# Patient Record
Sex: Male | Born: 1935
Health system: Southern US, Community
[De-identification: ages and names within clinical notes are randomized; demographics above are authoritative.]

## PROBLEM LIST (undated history)

## (undated) DIAGNOSIS — N400 Enlarged prostate without lower urinary tract symptoms: Secondary | ICD-10-CM

## (undated) DIAGNOSIS — M1990 Unspecified osteoarthritis, unspecified site: Secondary | ICD-10-CM

## (undated) DIAGNOSIS — I10 Essential (primary) hypertension: Secondary | ICD-10-CM

## (undated) HISTORY — PX: OTHER SURGICAL HISTORY: SHX169

## (undated) HISTORY — PX: VASECTOMY: SHX75

## (undated) HISTORY — PX: COLONOSCOPY: SHX174

## (undated) HISTORY — PX: HERNIA REPAIR: SHX51

---

## 2011-03-07 ENCOUNTER — Other Ambulatory Visit: Payer: Self-pay

## 2011-03-07 DIAGNOSIS — I83893 Varicose veins of bilateral lower extremities with other complications: Secondary | ICD-10-CM

## 2011-03-28 ENCOUNTER — Encounter: Payer: Self-pay | Admitting: Vascular Surgery

## 2011-03-31 ENCOUNTER — Other Ambulatory Visit (INDEPENDENT_AMBULATORY_CARE_PROVIDER_SITE_OTHER): Payer: Medicare Other | Admitting: *Deleted

## 2011-03-31 ENCOUNTER — Ambulatory Visit (INDEPENDENT_AMBULATORY_CARE_PROVIDER_SITE_OTHER): Payer: Medicare Other | Admitting: Vascular Surgery

## 2011-03-31 VITALS — BP 166/89 | HR 71 | Resp 20 | Ht 72.0 in | Wt 190.0 lb

## 2011-03-31 DIAGNOSIS — M7989 Other specified soft tissue disorders: Secondary | ICD-10-CM

## 2011-03-31 DIAGNOSIS — I83893 Varicose veins of bilateral lower extremities with other complications: Secondary | ICD-10-CM | POA: Insufficient documentation

## 2011-03-31 NOTE — Progress Notes (Signed)
Subjective:     Patient ID: Jesus Wagner, male   DOB: Oct 26, 1935, 76 y.o.   MRN: [id number]  HPI this 76 year old male patient has had bulging varicosities in the right posterior medial calf for many years. These have become more prominent. They have developed increasing symptomatology particularly burning and stinging discomfort which extends down to the medial ankle and foot on the sole of the foot. He does have swelling as the day progresses. He has had no history of stasis ulcers, bleeding, DVT, or thrombophlebitis. It is not wear elastic compression stockings. He cannot take ibuprofen because it upset his stomach. He elevates his leg on rare occasions. He states the symptoms are becoming more severe and are beginning to affect his daily living. He is able to ambulate several blocks without claudication.  No past medical history on file.  History  Substance Use Topics  . Smoking status: Not on file  . Smokeless tobacco: Not on file  . Alcohol Use: Not on file    No family history on file.  Allergies not on file  Current outpatient prescriptions:CRESTOR 10 MG tablet, , Disp: , Rfl: ;  lisinopril (PRINIVIL,ZESTRIL) 40 MG tablet, , Disp: , Rfl:   BP 166/89  Pulse 71  Resp 20  Ht 6' (1.829 m)  Wt 190 lb (86.183 kg)  BMI 25.77 kg/m2  Body mass index is 25.77 kg/(m^2).              Review of Systems he denies chest pain, dyspnea on exertion, PND, orthopnea, hemoptysis, lateralizing weakness, diplopia, blurred vision, syncope, and all other symptoms other than the present illness    Objective:   Physical Exam blood pressure 166 radioman heart rate 71 respirations 20 General well-developed well-nourished male no apparent distress alert and oriented x3 HEENT normal for age Lungs no rhonchi or wheezing Cardiovascular regular and no murmurs carotid pulses 3+ no audible bruits Abdomen soft nontender with no palpable masses Neurologic exam normal Skin free of rashes. There  are diffuse reticular and spider veins in the left leg in the lower third extending down into the ankle with 1+ edema and some early hyperpigmentation. There are bulging varicosities particularly in the popliteal fossa extending into the medial proximal calf area. He has 3+ femoral popliteal dorsalis pedis and posterior tibial pulses palpable bilaterally. Right leg is free of varicosities or edema.  Musculoskeletal free of major deformities  Today I ordered a venous reflux exam of the right leg which are reviewed and interpreted. The right great saphenous system has no significant reflux. The right small saphenous system has severe reflux with a prominent small saphenous vein with reflux down to the saphenofemoral popliteal junction. There is about an 8 cm segment of vein before it becomes quite tortuous. This supplies the majority of the varicosities previously described. There is no DVT or deep venous reflux.    Assessment:     Severe venous insufficiency right small saphenous vein with bulging varicosities and symptoms consistent with chronic venous insufficiency, hyperpigmentation, diffuse reticular and spider veins. This is due to gross reflux in right small saphenous system.    Plan:     #1 long-leg elastic compression stockings 20-30 mm gradient #2 elevate legs as much as possible intermittently during the day #3 Tylenol on a regular basis since he cannot tolerate ibuprofen #4 turn to see me in 3 months. If there is not been significant improvement in his symptomatology would recommend laser ablation of left small saphenous vein. He will  then return in 3 months with evaluation for possible stab phlebectomy of secondary bulging varicosities. The patient will return in 3 month

## 2011-04-08 NOTE — Procedures (Unsigned)
LOWER EXTREMITY VENOUS REFLUX EXAM  INDICATION:  EXAM:  Using color-flow imaging and pulse Doppler spectral analysis, the right common femoral, superficial femoral, popliteal, posterior tibial, greater and lesser saphenous veins are evaluated.  There is evidence suggesting deep venous insufficiency in the right lower extremity.  The right saphenofemoral junction is competent.  The right GSV is competent.  The right below-knee mid and distal GSV is incompetent with a reflux greater than 500 milliseconds.  The right proximal small saphenous vein demonstrates incompetency and measures 0.80 cm.  GSV Diameter (used if found to be incompetent only)                                           Right    Left Proximal Greater Saphenous Vein           cm       cm Proximal-to-mid-thigh                     cm       cm Mid thigh                                 cm       cm Mid-distal thigh                          cm       cm Distal thigh                              cm       cm Knee                                      cm       cm GSV below knee mid                        0.40 cm GSV below knee distal                     0.44 cm  IMPRESSION: 1. Right greater saphenous vein is competent in the thigh. 2. The right greater saphenous vein is not tortuous. 3. The deep venous system is not competent with Reflux of     >59milliseconds. 4. The right lesser saphenous vein is not competent with Reflux of     >587milliseconds in the proximal portion. 5. The greater saphenous vein below knee is not competent with Reflux     >500 milliseconds.        ___________________________________________ Quita Skye Hart [place], M.D.  SS/MEDQ  D:  03/31/2011  T:  03/31/2011  Job:  [id number]

## 2011-05-26 ENCOUNTER — Other Ambulatory Visit: Payer: Self-pay

## 2011-05-26 ENCOUNTER — Encounter: Payer: Self-pay | Admitting: Vascular Surgery

## 2011-06-27 ENCOUNTER — Encounter: Payer: Self-pay | Admitting: Vascular Surgery

## 2011-06-30 ENCOUNTER — Ambulatory Visit (INDEPENDENT_AMBULATORY_CARE_PROVIDER_SITE_OTHER): Payer: Medicare Other | Admitting: Vascular Surgery

## 2011-06-30 VITALS — BP 162/74 | HR 93 | Resp 20 | Ht 78.0 in | Wt 195.0 lb

## 2011-06-30 DIAGNOSIS — I83893 Varicose veins of bilateral lower extremities with other complications: Secondary | ICD-10-CM

## 2011-06-30 NOTE — Progress Notes (Signed)
Subjective:     Patient ID: Jesus Wagner, male   DOB: [birth year], 76 y.o.   MRN: [id number]  HPI this 76 year old male with severe venous insufficiency of the right leg returns today for continued followup he has been wearing long leg elastic compression stockings in the right leg as well as trying elevation a daily basis. He is unable to take anti-inflammatories such as ibuprofen. The stocking has improved the swelling which occurs during the day but he continues to have aching throbbing and burning discomfort extending down the posterior calf into the heel. He also has chronic edema which has not been totally relieved by the stocking but improved. He has no symptoms the contralateral left leg. He has no history of DVT or thrombophlebitis.  No past medical history on file.  History  Substance Use Topics  . Smoking status: Not on file  . Smokeless tobacco: Not on file  . Alcohol Use: Not on file    No family history on file.  No Known Allergies  Current outpatient prescriptions:CRESTOR 10 MG tablet, , Disp: , Rfl: ;  lisinopril (PRINIVIL,ZESTRIL) 40 MG tablet, , Disp: , Rfl:   BP 162/74  Pulse 93  Resp 20  Ht 6\' 6"  (1.981 m)  Wt 195 lb (88.451 kg)  BMI 22.53 kg/m2  Body mass index is 22.53 kg/(m^2).         Review of Systems     Objective:   Physical Exam pressure 160/74 heart rate 93 respirations 20 General well-developed well-nourished male no apparent distress alert and oriented x3 Right lower extremity has large bulging varicosities in the popliteal fossa extending down to the medial calf with diffuse spider and reticular veins around the ankle medially and laterally and 1+ edema.  Today I imaged the right small saphenous vein with the SonoSite and he does have a large small saphenous vein which has gross reflux coming from the popliteal vein supplying these bulging varicosities. This was confirmed by for formal duplex exam at the last visit 3 months ago      Assessment:     Severe venous insufficiency right leg secondary to gross reflux right small saphenous vein with bulging varicosities-not relieved by conservative measures and affecting his daily living    Plan:     I believe we should proceed with #1 laser ablation right small saphenous vein to be followed in 3 months with stab phlebectomy of multiple secondary varicosities right calf Will proceed with precertification to perform this in the near future

## 2011-07-04 ENCOUNTER — Other Ambulatory Visit: Payer: Self-pay | Admitting: *Deleted

## 2011-07-04 DIAGNOSIS — I83893 Varicose veins of bilateral lower extremities with other complications: Secondary | ICD-10-CM

## 2011-08-01 ENCOUNTER — Encounter: Payer: Self-pay | Admitting: Vascular Surgery

## 2011-08-04 ENCOUNTER — Encounter: Payer: Self-pay | Admitting: Vascular Surgery

## 2011-08-04 ENCOUNTER — Ambulatory Visit (INDEPENDENT_AMBULATORY_CARE_PROVIDER_SITE_OTHER): Payer: Medicare Other | Admitting: Vascular Surgery

## 2011-08-04 VITALS — BP 194/81 | HR 71 | Resp 20 | Ht 72.0 in | Wt 195.0 lb

## 2011-08-04 DIAGNOSIS — I83893 Varicose veins of bilateral lower extremities with other complications: Secondary | ICD-10-CM

## 2011-08-04 NOTE — Progress Notes (Signed)
Subjective:     Patient ID: Jesus Wagner, male   DOB: 1936-03-19, 76 y.o.   MRN: [id number]  HPI this 76 year old male had laser ablation of the right small saphenous vein performed under local tumescent anesthesia. He tolerated the procedure well. He had a large vein which became tortuous fairly early and a short segment of the vein adjacent to where it connects to the popliteal vein was closed with laser ablation. A total of 435 J of energy was utilized.   Review of Systems     Objective:   Physical ExamBP 194/81  Pulse 71  Resp 20  Ht 6' (1.829 m)  Wt 195 lb (88.451 kg)  BMI 26.45 kg/m2       Assessment:     Well-tolerated laser ablation left small saphenous vein performed under local tumescent anesthesia    Plan:     Return in one week for venous duplex exam to confirm closure of short segment left small saphenous vein adjacent to popliteal vein

## 2011-08-04 NOTE — Progress Notes (Signed)
Laser Ablation Procedure      Date: 08/04/2011    Jesus Wagner DOB:[birth year]  Consent signed: Yes  Surgeon:J.D. Hart [place]  Procedure: Laser Ablation: right Small Saphenous Vein  BP 194/81  Pulse 71  Resp 20  Ht 6' (1.829 m)  Wt 195 lb (88.451 kg)  BMI 26.45 kg/m2  Start time: 11:10   End time: 11:40  Tumescent Anesthesia: 100 cc 0.9% NaCl with 50 cc Lidocaine HCL with 1% Epi and 15 cc 8.4% NaHCO3  Local Anesthesia: 4 cc Lidocaine HCL and NaHCO3 (ratio 2:1)  Pulsed mode: Watts 15 Seconds 1 Pulses:1 Total Pulses:29 Total Energy: 435 Total Time: :29   Patient tolerated procedure well: Yes  Notes:   Description of Procedure:  After marking the course of the saphenous vein and the secondary varicosities in the standing position, the patient was placed on the operating table in the prone position, and the right leg was prepped and draped in sterile fashion. Local anesthetic was administered, and under ultrasound guidance the saphenous vein was accessed with a micro needle and guide wire; then the micro puncture sheath was placed. A guide wire was inserted to the saphenopopliteal junction, followed by a 5 french sheath.  The position of the sheath and then the laser fiber below the junction was confirmed using the ultrasound and visualization of the aiming beam.  Tumescent anesthesia was administered along the course of the saphenous vein using ultrasound guidance. Protective laser glasses were placed on the patient, and the laser was fired at 15 watt pulsed mode advancing 1-2 mm per sec.  For a total of 435 joules.  A steri strip was applied to the puncture site.  ABD pads and thigh high compression stockings were applied.  Ace wrap bandages were applied over the phlebectomy sites and at the top of the saphenopopliteal junction.  Blood loss was less than 15 cc.  The patient ambulated out of the operating room having tolerated the procedure well.

## 2011-08-05 ENCOUNTER — Telehealth: Payer: Self-pay | Admitting: *Deleted

## 2011-08-05 ENCOUNTER — Encounter: Payer: Self-pay | Admitting: Vascular Surgery

## 2011-08-05 NOTE — Telephone Encounter (Signed)
Spoke with the patient's wife. She said he is doing well and had a good night last night. Reminded her of instructions and his fu appt next week.

## 2011-08-08 ENCOUNTER — Encounter: Payer: Self-pay | Admitting: Vascular Surgery

## 2011-08-11 ENCOUNTER — Encounter: Payer: Self-pay | Admitting: Vascular Surgery

## 2011-08-11 ENCOUNTER — Encounter (INDEPENDENT_AMBULATORY_CARE_PROVIDER_SITE_OTHER): Payer: Medicare Other | Admitting: *Deleted

## 2011-08-11 ENCOUNTER — Ambulatory Visit (INDEPENDENT_AMBULATORY_CARE_PROVIDER_SITE_OTHER): Payer: Medicare Other | Admitting: Vascular Surgery

## 2011-08-11 VITALS — BP 174/85 | HR 80 | Resp 18 | Ht 72.0 in | Wt 195.0 lb

## 2011-08-11 DIAGNOSIS — I83893 Varicose veins of bilateral lower extremities with other complications: Secondary | ICD-10-CM

## 2011-08-11 DIAGNOSIS — Z48812 Encounter for surgical aftercare following surgery on the circulatory system: Secondary | ICD-10-CM

## 2011-08-11 DIAGNOSIS — I8310 Varicose veins of unspecified lower extremity with inflammation: Secondary | ICD-10-CM

## 2011-08-11 NOTE — Progress Notes (Signed)
Subjective:     Patient ID: Jesus Wagner, male   DOB: [birth year], 76 y.o.   MRN: [id number]  HPI this 76 year old male returns 1 week post laser ablation of the right small saphenous vein for painful varicosities he tolerated the procedure well performed last week under local tumescent anesthesia. He states the leg feels much better. It is less tight and less throbby. He has had no change in distal edema. He has been wearing his elastic compression stocking as well as elevating his leg and taking ibuprofen as instructed.  No past medical history on file.  History  Substance Use Topics  . Smoking status: Never Smoker   . Smokeless tobacco: Never Used  . Alcohol Use: No    No family history on file.  Allergies  Allergen Reactions  . Aspirin     Current outpatient prescriptions:CRESTOR 10 MG tablet, , Disp: , Rfl: ;  lisinopril (PRINIVIL,ZESTRIL) 40 MG tablet, , Disp: , Rfl:   BP 174/85  Pulse 80  Resp 18  Ht 6' (1.829 m)  Wt 195 lb (88.451 kg)  BMI 26.45 kg/m2  Body mass index is 26.45 kg/(m^2).          Review of Systems denies chest pain, dyspnea on exertion, PND, orthopnea, hemoptysis     Objective:   Physical Exam blood pressure 174/85 heart rate 80 respirations 18 Right lower extremity with 3+ femoral and dorsalis pedis pulse palpable. The bulging varicosities in the posterior calf extending up into the distal thigh R. Mus less tense than previously. There is 1+ distal edema. No active ulcers are noted.  Today I ordered a venous duplex exam of the right leg which are reviewed and interpreted. There is no DVT. There is total closure of the right small saphenous vein from the proximal calf to the saphenous popliteal junction. There was only a short segment which could be closed.    Assessment:     Successful closure right small saphenous vein with laser ablation-severe venous insufficiency with bulging varicosities    Plan:     Return in 3 months to see if stab  phlebectomy indicated for secondary varicosities in calf and distal thigh  Will continue to wear elastic compression stockings for symptomatic relief

## 2011-08-19 NOTE — Procedures (Unsigned)
DUPLEX DEEP VENOUS EXAM - LOWER EXTREMITY  INDICATION:  Right small saphenous vein ablation 08/04/2011.  HISTORY:  Edema:  No Trauma/Surgery:  Right small saphenous vein ablation Pain:  No PE:  No Previous DVT:  No Anticoagulants:  No Other:  DUPLEX EXAM:               CFV   SFV   PopV  PTV    SSV               R  L  R  L  R  L  R   L  R  L Thrombosis    o  o  o     o     o      + Spontaneous   +  +  +     +     +      0 Phasic        +  +  +     +     +      0 Augmentation  +  +  +     +     +      0 Compressible  +  +  +     +     +      0 Competent     +  +  +     +     +      +  Legend:  + - yes  o - no  p - partial  D - decreased   IMPRESSION: 1. Successful right small saphenous vein ablation with no flow     visualized in the proximal, mid or distal segments. 2. No evidence of right lower extremity deep venous thrombosis.      _____________________________ Quita Skye Hart [place], M.D.  EM/MEDQ  D:  08/12/2011  T:  08/12/2011  Job:  [id number]

## 2011-11-17 ENCOUNTER — Ambulatory Visit: Payer: BC Managed Care – PPO | Admitting: Vascular Surgery

## 2013-07-18 ENCOUNTER — Other Ambulatory Visit (HOSPITAL_COMMUNITY): Payer: Self-pay | Admitting: Orthopaedic Surgery

## 2013-07-21 ENCOUNTER — Encounter (HOSPITAL_COMMUNITY): Payer: Self-pay | Admitting: Pharmacy Technician

## 2013-07-26 ENCOUNTER — Encounter (INDEPENDENT_AMBULATORY_CARE_PROVIDER_SITE_OTHER): Payer: Self-pay

## 2013-07-26 ENCOUNTER — Ambulatory Visit (HOSPITAL_COMMUNITY)
Admission: RE | Admit: 2013-07-26 | Discharge: 2013-07-26 | Disposition: A | Discharge status: Home / Self Care | Payer: Medicare Other | Source: Ambulatory Visit | Attending: Orthopaedic Surgery | Admitting: Orthopaedic Surgery

## 2013-07-26 ENCOUNTER — Encounter (HOSPITAL_COMMUNITY)
Admission: RE | Admit: 2013-07-26 | Discharge: 2013-07-26 | Disposition: A | Discharge status: Home / Self Care | Payer: Medicare Other | Source: Ambulatory Visit | Attending: Orthopaedic Surgery | Admitting: Orthopaedic Surgery

## 2013-07-26 ENCOUNTER — Encounter (HOSPITAL_COMMUNITY): Payer: Self-pay

## 2013-07-26 DIAGNOSIS — Z01818 Encounter for other preprocedural examination: Secondary | ICD-10-CM | POA: Insufficient documentation

## 2013-07-26 DIAGNOSIS — Z01812 Encounter for preprocedural laboratory examination: Secondary | ICD-10-CM | POA: Insufficient documentation

## 2013-07-26 HISTORY — DX: Benign prostatic hyperplasia without lower urinary tract symptoms: N40.0

## 2013-07-26 HISTORY — DX: Essential (primary) hypertension: I10

## 2013-07-26 LAB — SURGICAL PCR SCREEN
MRSA, PCR: NEGATIVE
Staphylococcus aureus: POSITIVE — AB

## 2013-07-26 LAB — URINALYSIS, ROUTINE W REFLEX MICROSCOPIC
BILIRUBIN URINE: NEGATIVE
GLUCOSE, UA: NEGATIVE mg/dL
HGB URINE DIPSTICK: NEGATIVE
KETONES UR: NEGATIVE mg/dL
Leukocytes, UA: NEGATIVE
NITRITE: NEGATIVE
PH: 6 (ref 5.0–8.0)
Protein, ur: NEGATIVE mg/dL
Specific Gravity, Urine: 1.013 (ref 1.005–1.030)
Urobilinogen, UA: 0.2 mg/dL (ref 0.0–1.0)

## 2013-07-26 LAB — PROTIME-INR
INR: 0.97 (ref 0.00–1.49)
Prothrombin Time: 12.7 seconds (ref 11.6–15.2)

## 2013-07-26 LAB — CBC
HEMATOCRIT: 39.6 % (ref 39.0–52.0)
Hemoglobin: 14.3 g/dL (ref 13.0–17.0)
MCH: 31.7 pg (ref 26.0–34.0)
MCHC: 36.1 g/dL — ABNORMAL HIGH (ref 30.0–36.0)
MCV: 87.8 fL (ref 78.0–100.0)
PLATELETS: 172 10*3/uL (ref 150–400)
RBC: 4.51 MIL/uL (ref 4.22–5.81)
RDW: 12.1 % (ref 11.5–15.5)
WBC: 6.3 10*3/uL (ref 4.0–10.5)

## 2013-07-26 LAB — BASIC METABOLIC PANEL
BUN: 14 mg/dL (ref 6–23)
CALCIUM: 9.3 mg/dL (ref 8.4–10.5)
CHLORIDE: 100 meq/L (ref 96–112)
CO2: 27 mEq/L (ref 19–32)
Creatinine, Ser: 0.76 mg/dL (ref 0.50–1.35)
GFR calc Af Amer: >90 mL/min (ref >90)
GFR calc non Af Amer: 86 mL/min — ABNORMAL LOW (ref >90)
GLUCOSE: 98 mg/dL (ref 70–99)
Potassium: 4.8 mEq/L (ref 3.7–5.3)
Sodium: 138 mEq/L (ref 137–147)

## 2013-07-26 LAB — APTT: aPTT: 30 seconds (ref 24–37)

## 2013-07-26 NOTE — Pre-Procedure Instructions (Signed)
07-26-13 EKG 07-14-13 report with chart. CXR done today.

## 2013-07-26 NOTE — Patient Instructions (Signed)
20 Jesus PitchRobert Hagey  07/26/2013   Your procedure is scheduled on:  5-15 -2015  [facility] at       0700  AM.  Call this number if you have problems the morning of surgery: [phone]  Or Presurgical Testing [phone]([name]) For Living Will and/or Health Care Power Attorney Forms: please provide copy for your medical record,may bring AM of surgery(Forms should be already notarized -we do not provide this service).(Pt. To bring if can locate at home 08-05-13)      Do not eat food:After Midnight.    Take these medicines the morning of surgery with A SIP OF WATER: pain med if needed.   Do not wear jewelry, make-up or nail polish.  Do not wear lotions, powders, or perfumes. You may wear deodorant.  Do not shave 48 hours(2 days) prior to first CHG shower(legs and under arms).(Shaving face and neck okay.)  Do not bring valuables to the hospital.(Hospital is not responsible for lost valuables).  Contacts, dentures or removable bridgework, body piercing, hair pins may not be worn into surgery.  Leave suitcase in the car. After surgery it may be brought to your room.  For patients admitted to the hospital, checkout time is 11:00 AM the day of discharge.(Restricted visitors-Any Persons displaying flu-like symptoms or illness).    Patients discharged the day of surgery will not be allowed to drive home. Must have responsible person with you x 24 hours once discharged.  Name and phone number of your driver:  Jesus Wagner spouse [phone] h Special Instructions: CHG(Chlorhedine 4%-"Hibiclens","Betasept","Aplicare") Shower Use Special Wash: see special instructions.(avoid face and genitals)   Please read over the following fact sheets that you were given: MRSA Information, Blood Transfusion fact sheet.  Remember : Type/Screen "Blue armbands" - may not be removed once applied(would result in being retested AM of surgery, if removed).  Failure to follow these instructions may  result in Cancellation of your surgery.   Patient signature_______________________________________________________

## 2013-07-27 NOTE — Progress Notes (Signed)
07-27-13 1700 notified of Positive PCR screen for Staph aureus. Rx. [facility] [phone], pt. Made aware.

## 2013-08-05 ENCOUNTER — Encounter (HOSPITAL_COMMUNITY): Payer: Medicare Other | Admitting: Anesthesiology

## 2013-08-05 ENCOUNTER — Encounter (HOSPITAL_COMMUNITY): Payer: Self-pay

## 2013-08-05 ENCOUNTER — Inpatient Hospital Stay (HOSPITAL_COMMUNITY)
Admission: RE | Admit: 2013-08-05 | Discharge: 2013-08-08 | DRG: 470 | Disposition: A | Discharge status: Home Health | Payer: Medicare Other | Source: Ambulatory Visit | Attending: Orthopaedic Surgery | Admitting: Orthopaedic Surgery

## 2013-08-05 ENCOUNTER — Inpatient Hospital Stay (HOSPITAL_COMMUNITY): Payer: Medicare Other | Admitting: Anesthesiology

## 2013-08-05 ENCOUNTER — Inpatient Hospital Stay (HOSPITAL_COMMUNITY): Payer: Medicare Other

## 2013-08-05 ENCOUNTER — Encounter (HOSPITAL_COMMUNITY)
Admission: RE | Disposition: A | Discharge status: Home Health | Payer: Self-pay | Source: Ambulatory Visit | Attending: Orthopaedic Surgery

## 2013-08-05 DIAGNOSIS — M79609 Pain in unspecified limb: Secondary | ICD-10-CM

## 2013-08-05 DIAGNOSIS — I10 Essential (primary) hypertension: Secondary | ICD-10-CM | POA: Diagnosis present

## 2013-08-05 DIAGNOSIS — M169 Osteoarthritis of hip, unspecified: Principal | ICD-10-CM | POA: Diagnosis present

## 2013-08-05 DIAGNOSIS — M1612 Unilateral primary osteoarthritis, left hip: Secondary | ICD-10-CM

## 2013-08-05 DIAGNOSIS — M1610 Unilateral primary osteoarthritis, unspecified hip: Principal | ICD-10-CM | POA: Diagnosis present

## 2013-08-05 DIAGNOSIS — Z96649 Presence of unspecified artificial hip joint: Secondary | ICD-10-CM

## 2013-08-05 HISTORY — PX: TOTAL HIP ARTHROPLASTY: SHX124

## 2013-08-05 LAB — TYPE AND SCREEN
ABO/RH(D): O POS
ANTIBODY SCREEN: NEGATIVE

## 2013-08-05 LAB — ABO/RH: ABO/RH(D): O POS

## 2013-08-05 SURGERY — ARTHROPLASTY, HIP, TOTAL, ANTERIOR APPROACH
Anesthesia: Spinal | Site: Hip | Laterality: Left

## 2013-08-05 MED ORDER — OXYCODONE HCL 5 MG PO TABS
5.0000 mg | ORAL_TABLET | ORAL | Status: DC | PRN
  Administered 2013-08-05: 5 mg via ORAL
  Administered 2013-08-05 – 2013-08-06 (×7): 10 mg via ORAL
  Administered 2013-08-06: 5 mg via ORAL
  Administered 2013-08-07 (×3): 10 mg via ORAL
  Administered 2013-08-07: 5 mg via ORAL
  Administered 2013-08-08 (×2): 10 mg via ORAL
  Filled 2013-08-05: qty 1
  Filled 2013-08-05 (×2): qty 2
  Filled 2013-08-05: qty 1
  Filled 2013-08-05 (×5): qty 2
  Filled 2013-08-05: qty 1
  Filled 2013-08-05 (×5): qty 2

## 2013-08-05 MED ORDER — MEPERIDINE HCL 50 MG/ML IJ SOLN
6.2500 mg | INTRAMUSCULAR | Status: DC | PRN
  Administered 2013-08-05: 12.5 mg via INTRAVENOUS

## 2013-08-05 MED ORDER — PHENYLEPHRINE HCL 10 MG/ML IJ SOLN
INTRAMUSCULAR | Status: AC
  Filled 2013-08-05: qty 1

## 2013-08-05 MED ORDER — CEFAZOLIN SODIUM-DEXTROSE 2-3 GM-% IV SOLR
2.0000 g | INTRAVENOUS | Status: AC
  Administered 2013-08-05: 2 g via INTRAVENOUS

## 2013-08-05 MED ORDER — PHENYLEPHRINE HCL 10 MG/ML IJ SOLN
INTRAMUSCULAR | Status: DC | PRN
  Administered 2013-08-05: 40 ug via INTRAVENOUS

## 2013-08-05 MED ORDER — ONDANSETRON HCL 4 MG PO TABS
4.0000 mg | ORAL_TABLET | Freq: Four times a day (QID) | ORAL | Status: DC | PRN
  Administered 2013-08-07: 4 mg via ORAL
  Filled 2013-08-05: qty 1

## 2013-08-05 MED ORDER — PROPOFOL INFUSION 10 MG/ML OPTIME
INTRAVENOUS | Status: DC | PRN
  Administered 2013-08-05: 100 ug/kg/min via INTRAVENOUS

## 2013-08-05 MED ORDER — POLYETHYLENE GLYCOL 3350 17 G PO PACK
17.0000 g | PACK | Freq: Every day | ORAL | Status: DC | PRN
  Administered 2013-08-07: 17 g via ORAL

## 2013-08-05 MED ORDER — VITAMIN D3 25 MCG (1000 UNIT) PO TABS
1000.0000 [IU] | ORAL_TABLET | Freq: Every day | ORAL | Status: DC
  Administered 2013-08-06 – 2013-08-07 (×2): 1000 [IU] via ORAL
  Filled 2013-08-05 (×3): qty 1

## 2013-08-05 MED ORDER — MEPERIDINE HCL 50 MG/ML IJ SOLN
INTRAMUSCULAR | Status: AC
  Filled 2013-08-05: qty 1

## 2013-08-05 MED ORDER — PROPOFOL 10 MG/ML IV BOLUS
INTRAVENOUS | Status: AC
  Filled 2013-08-05: qty 20

## 2013-08-05 MED ORDER — METHOCARBAMOL 1000 MG/10ML IJ SOLN
500.0000 mg | Freq: Four times a day (QID) | INTRAVENOUS | Status: DC | PRN
  Administered 2013-08-05: 500 mg via INTRAVENOUS
  Filled 2013-08-05: qty 5

## 2013-08-05 MED ORDER — CEFAZOLIN SODIUM-DEXTROSE 2-3 GM-% IV SOLR
INTRAVENOUS | Status: AC
  Filled 2013-08-05: qty 50

## 2013-08-05 MED ORDER — DOCUSATE SODIUM 100 MG PO CAPS
100.0000 mg | ORAL_CAPSULE | Freq: Two times a day (BID) | ORAL | Status: DC
  Administered 2013-08-05 – 2013-08-07 (×5): 100 mg via ORAL

## 2013-08-05 MED ORDER — METHOCARBAMOL 500 MG PO TABS
500.0000 mg | ORAL_TABLET | Freq: Four times a day (QID) | ORAL | Status: DC | PRN
  Administered 2013-08-05 – 2013-08-08 (×8): 500 mg via ORAL
  Filled 2013-08-05 (×8): qty 1

## 2013-08-05 MED ORDER — ALUM & MAG HYDROXIDE-SIMETH 200-200-20 MG/5ML PO SUSP: 30.0000 mL | ORAL | Status: DC | PRN

## 2013-08-05 MED ORDER — HYDROMORPHONE HCL PF 1 MG/ML IJ SOLN
INTRAMUSCULAR | Status: AC
  Filled 2013-08-05: qty 1

## 2013-08-05 MED ORDER — BUPIVACAINE HCL (PF) 0.5 % IJ SOLN
INTRAMUSCULAR | Status: AC
  Filled 2013-08-05: qty 30

## 2013-08-05 MED ORDER — LACTATED RINGERS IV SOLN: INTRAVENOUS | Status: DC

## 2013-08-05 MED ORDER — SODIUM CHLORIDE 0.9 % IR SOLN
Status: DC | PRN
Start: 2013-08-05 — End: 2013-08-05
  Administered 2013-08-05: 1000 mL

## 2013-08-05 MED ORDER — HYDROMORPHONE HCL PF 1 MG/ML IJ SOLN
0.2500 mg | INTRAMUSCULAR | Status: DC | PRN
  Administered 2013-08-05 (×4): 0.5 mg via INTRAVENOUS

## 2013-08-05 MED ORDER — SODIUM CHLORIDE 0.9 % IV SOLN
1000.0000 mg | INTRAVENOUS | Status: AC
  Administered 2013-08-05: 1000 mg via INTRAVENOUS
  Filled 2013-08-05: qty 10

## 2013-08-05 MED ORDER — HYDROMORPHONE HCL PF 1 MG/ML IJ SOLN
1.0000 mg | INTRAMUSCULAR | Status: DC | PRN
  Administered 2013-08-05 (×4): 1 mg via INTRAVENOUS
  Filled 2013-08-05 (×3): qty 1

## 2013-08-05 MED ORDER — MIDAZOLAM HCL 5 MG/5ML IJ SOLN
INTRAMUSCULAR | Status: DC | PRN
  Administered 2013-08-05 (×2): 1 mg via INTRAVENOUS

## 2013-08-05 MED ORDER — 0.9 % SODIUM CHLORIDE (POUR BTL) OPTIME
TOPICAL | Status: DC | PRN
  Administered 2013-08-05: 1000 mL

## 2013-08-05 MED ORDER — LIDOCAINE HCL (CARDIAC) 20 MG/ML IV SOLN
INTRAVENOUS | Status: AC
  Filled 2013-08-05: qty 5

## 2013-08-05 MED ORDER — LACTATED RINGERS IV SOLN
INTRAVENOUS | Status: DC | PRN
  Administered 2013-08-05 (×2): via INTRAVENOUS

## 2013-08-05 MED ORDER — ACETAMINOPHEN 325 MG PO TABS: 650.0000 mg | ORAL_TABLET | Freq: Four times a day (QID) | ORAL | Status: DC | PRN

## 2013-08-05 MED ORDER — GABAPENTIN 100 MG PO CAPS
100.0000 mg | ORAL_CAPSULE | Freq: Two times a day (BID) | ORAL | Status: DC
  Administered 2013-08-05 – 2013-08-07 (×5): 100 mg via ORAL
  Filled 2013-08-05 (×11): qty 1

## 2013-08-05 MED ORDER — METOCLOPRAMIDE HCL 5 MG/ML IJ SOLN: 5.0000 mg | Freq: Three times a day (TID) | INTRAMUSCULAR | Status: DC | PRN

## 2013-08-05 MED ORDER — PHENOL 1.4 % MT LIQD: 1.0000 | OROMUCOSAL | Status: DC | PRN

## 2013-08-05 MED ORDER — SODIUM CHLORIDE 0.9 % IV SOLN
INTRAVENOUS | Status: DC
  Administered 2013-08-05 – 2013-08-06 (×2): via INTRAVENOUS

## 2013-08-05 MED ORDER — TAMSULOSIN HCL 0.4 MG PO CAPS
0.4000 mg | ORAL_CAPSULE | Freq: Every day | ORAL | Status: DC
  Administered 2013-08-05 – 2013-08-07 (×3): 0.4 mg via ORAL
  Filled 2013-08-05 (×4): qty 1

## 2013-08-05 MED ORDER — RIVAROXABAN 10 MG PO TABS
10.0000 mg | ORAL_TABLET | Freq: Every day | ORAL | Status: DC
  Administered 2013-08-06 – 2013-08-08 (×3): 10 mg via ORAL
  Filled 2013-08-05 (×4): qty 1

## 2013-08-05 MED ORDER — CEFAZOLIN SODIUM 1-5 GM-% IV SOLN
1.0000 g | Freq: Four times a day (QID) | INTRAVENOUS | Status: AC
  Administered 2013-08-05 (×2): 1 g via INTRAVENOUS
  Filled 2013-08-05 (×2): qty 50

## 2013-08-05 MED ORDER — ONDANSETRON HCL 4 MG/2ML IJ SOLN
4.0000 mg | Freq: Four times a day (QID) | INTRAMUSCULAR | Status: DC | PRN
  Administered 2013-08-06: 4 mg via INTRAVENOUS
  Filled 2013-08-05: qty 2

## 2013-08-05 MED ORDER — METOCLOPRAMIDE HCL 10 MG PO TABS: 5.0000 mg | ORAL_TABLET | Freq: Three times a day (TID) | ORAL | Status: DC | PRN

## 2013-08-05 MED ORDER — BUPIVACAINE HCL (PF) 0.5 % IJ SOLN
INTRAMUSCULAR | Status: DC | PRN
  Administered 2013-08-05: 3 mL

## 2013-08-05 MED ORDER — PROMETHAZINE HCL 25 MG/ML IJ SOLN: 6.2500 mg | INTRAMUSCULAR | Status: DC | PRN

## 2013-08-05 MED ORDER — OXYCODONE HCL ER 10 MG PO T12A
10.0000 mg | EXTENDED_RELEASE_TABLET | Freq: Two times a day (BID) | ORAL | Status: DC
  Administered 2013-08-05 – 2013-08-07 (×5): 10 mg via ORAL
  Filled 2013-08-05 (×5): qty 1

## 2013-08-05 MED ORDER — LISINOPRIL 40 MG PO TABS
40.0000 mg | ORAL_TABLET | Freq: Every day | ORAL | Status: DC
  Administered 2013-08-06 – 2013-08-07 (×2): 40 mg via ORAL
  Filled 2013-08-05 (×4): qty 1

## 2013-08-05 MED ORDER — KETOROLAC TROMETHAMINE 15 MG/ML IJ SOLN
7.5000 mg | Freq: Four times a day (QID) | INTRAMUSCULAR | Status: DC | PRN
  Administered 2013-08-05 – 2013-08-07 (×3): 7.5 mg via INTRAVENOUS
  Filled 2013-08-05 (×3): qty 1

## 2013-08-05 MED ORDER — DIPHENHYDRAMINE HCL 12.5 MG/5ML PO ELIX: 12.5000 mg | ORAL_SOLUTION | ORAL | Status: DC | PRN

## 2013-08-05 MED ORDER — PHENYLEPHRINE HCL 10 MG/ML IJ SOLN
10.0000 mg | INTRAVENOUS | Status: DC | PRN
  Administered 2013-08-05: 10 ug/min via INTRAVENOUS

## 2013-08-05 MED ORDER — ACETAMINOPHEN 650 MG RE SUPP: 650.0000 mg | Freq: Four times a day (QID) | RECTAL | Status: DC | PRN

## 2013-08-05 MED ORDER — ONDANSETRON HCL 4 MG/2ML IJ SOLN
INTRAMUSCULAR | Status: AC
  Filled 2013-08-05: qty 2

## 2013-08-05 MED ORDER — HYDROMORPHONE HCL PF 1 MG/ML IJ SOLN
1.0000 mg | INTRAMUSCULAR | Status: DC | PRN
  Administered 2013-08-05: 1 mg via INTRAVENOUS
  Filled 2013-08-05 (×2): qty 1

## 2013-08-05 MED ORDER — MENTHOL 3 MG MT LOZG
1.0000 | LOZENGE | OROMUCOSAL | Status: DC | PRN
  Filled 2013-08-05: qty 9

## 2013-08-05 MED ORDER — PROPOFOL 10 MG/ML IV EMUL
INTRAVENOUS | Status: DC | PRN
  Administered 2013-08-05: 30 mg via INTRAVENOUS

## 2013-08-05 MED ORDER — KETAMINE HCL 10 MG/ML IJ SOLN
INTRAMUSCULAR | Status: DC | PRN
  Administered 2013-08-05 (×5): 10 mg via INTRAVENOUS

## 2013-08-05 MED ORDER — MIDAZOLAM HCL 2 MG/2ML IJ SOLN
INTRAMUSCULAR | Status: AC
  Filled 2013-08-05: qty 2

## 2013-08-05 SURGICAL SUPPLY — 42 items
BAG ZIPLOCK 12X15 (MISCELLANEOUS) IMPLANT
BENZOIN TINCTURE PRP APPL 2/3 (GAUZE/BANDAGES/DRESSINGS) ×3 IMPLANT
BLADE SAW SGTL 18X1.27X75 (BLADE) ×2 IMPLANT
BLADE SAW SGTL 18X1.27X75MM (BLADE) ×1
CAPT HIP PF COP ×3 IMPLANT
CELLS DAT CNTRL 66122 CELL SVR (MISCELLANEOUS) ×1 IMPLANT
CLOSURE WOUND 1/2 X4 (GAUZE/BANDAGES/DRESSINGS) ×1
COVER PERINEAL POST (MISCELLANEOUS) ×3 IMPLANT
DRAPE C-ARM 42X120 X-RAY (DRAPES) ×3 IMPLANT
DRAPE STERI IOBAN 125X83 (DRAPES) ×3 IMPLANT
DRAPE U-SHAPE 47X51 STRL (DRAPES) ×9 IMPLANT
DRSG AQUACEL AG ADV 3.5X10 (GAUZE/BANDAGES/DRESSINGS) ×3 IMPLANT
DURAPREP 26ML APPLICATOR (WOUND CARE) ×3 IMPLANT
ELECT BLADE TIP CTD 4 INCH (ELECTRODE) ×3 IMPLANT
ELECT REM PT RETURN 9FT ADLT (ELECTROSURGICAL) ×3
ELECTRODE REM PT RTRN 9FT ADLT (ELECTROSURGICAL) ×1 IMPLANT
FACESHIELD WRAPAROUND (MASK) ×12 IMPLANT
GAUZE XEROFORM 1X8 LF (GAUZE/BANDAGES/DRESSINGS) IMPLANT
GLOVE BIO SURGEON STRL SZ7.5 (GLOVE) ×3 IMPLANT
GLOVE BIOGEL PI IND STRL 8 (GLOVE) ×2 IMPLANT
GLOVE BIOGEL PI INDICATOR 8 (GLOVE) ×4
GLOVE ECLIPSE 8.0 STRL XLNG CF (GLOVE) ×3 IMPLANT
GOWN STRL REUS W/TWL XL LVL3 (GOWN DISPOSABLE) ×6 IMPLANT
HANDPIECE INTERPULSE COAX TIP (DISPOSABLE) ×2
KIT BASIN OR (CUSTOM PROCEDURE TRAY) ×3 IMPLANT
LINER BOOT UNIVERSAL DISP (MISCELLANEOUS) ×3 IMPLANT
PACK TOTAL JOINT (CUSTOM PROCEDURE TRAY) ×3 IMPLANT
PADDING CAST COTTON 6X4 STRL (CAST SUPPLIES) ×3 IMPLANT
RTRCTR WOUND ALEXIS 18CM MED (MISCELLANEOUS) ×3
SET HNDPC FAN SPRY TIP SCT (DISPOSABLE) ×1 IMPLANT
STAPLER VISISTAT 35W (STAPLE) IMPLANT
STRIP CLOSURE SKIN 1/2X4 (GAUZE/BANDAGES/DRESSINGS) ×2 IMPLANT
SUT ETHIBOND NAB CT1 #1 30IN (SUTURE) ×3 IMPLANT
SUT ETHILON 3 0 PS 1 (SUTURE) IMPLANT
SUT MNCRL AB 4-0 PS2 18 (SUTURE) ×3 IMPLANT
SUT VIC AB 0 CT1 36 (SUTURE) ×3 IMPLANT
SUT VIC AB 1 CT1 36 (SUTURE) ×3 IMPLANT
SUT VIC AB 2-0 CT1 27 (SUTURE) ×4
SUT VIC AB 2-0 CT1 TAPERPNT 27 (SUTURE) ×2 IMPLANT
TOWEL OR 17X26 10 PK STRL BLUE (TOWEL DISPOSABLE) ×3 IMPLANT
TOWEL OR NON WOVEN STRL DISP B (DISPOSABLE) ×3 IMPLANT
TRAY FOLEY CATH 16FRSI W/METER (SET/KITS/TRAYS/PACK) ×3 IMPLANT

## 2013-08-05 NOTE — Anesthesia Procedure Notes (Signed)
Spinal  Patient location during procedure: OR Staffing Anesthesiologist: Salley Scarlet Performed by: anesthesiologist  Preanesthetic Checklist Completed: patient identified, site marked, surgical consent, pre-op evaluation, timeout performed, IV checked, risks and benefits discussed and monitors and equipment checked Spinal Block Patient position: sitting Prep: Betadine Patient monitoring: heart rate, continuous pulse ox and blood pressure Approach: midline Location: L3-4 Injection technique: single-shot Needle Needle type: Spinocan  Needle gauge: 22 G Needle length: 9 cm Additional Notes Expiration date of kit checked and confirmed. Patient tolerated procedure well, without complications.

## 2013-08-05 NOTE — Progress Notes (Signed)
Left lower extremity venous duplex completed.  Left:  No evidence of DVT, superficial thrombosis, or Baker's cyst.  Right:  Negative for DVT in the common femoral vein.  

## 2013-08-05 NOTE — Transfer of Care (Signed)
Immediate Anesthesia Transfer of Care Note  Patient: Jesus Wagner  Procedure(s) Performed: Procedure(s): LEFT TOTAL HIP ARTHROPLASTY ANTERIOR APPROACH (Left)  Patient Location: PACU  Anesthesia Type:Spinal  Level of Consciousness: awake, alert  and oriented  Airway & Oxygen Therapy: Patient Spontanous Breathing and Patient connected to face mask oxygen  Post-op Assessment: Report given to PACU RN and Post -op Vital signs reviewed and stable  Post vital signs: Reviewed and stable  Complications: No apparent anesthesia complications

## 2013-08-05 NOTE — Evaluation (Signed)
Physical Therapy Evaluation Patient Details Name: Jesus Wagner MRN: [id number] DOB: Mar 23, 1936 Today's Date: 08/05/2013   History of Present Illness  L THR  Clinical Impression  Pt s/p L THR presents with decreased L LE strength/ROM and post op pain/nausea limiting functional mobility.  Pt should progress to d/c home with family assist and HHPT follow up.    Follow Up Recommendations Home health PT    Equipment Recommendations  Rolling walker with 5" wheels    Recommendations for Other Services OT consult     Precautions / Restrictions Precautions Precautions: Fall Restrictions Weight Bearing Restrictions: No Other Position/Activity Restrictions: WBAT      Mobility  Bed Mobility Overal bed mobility: Needs Assistance Bed Mobility: Supine to Sit     Supine to sit: Mod assist     General bed mobility comments: cues for sequencing and use of R LE to self assist  Transfers Overall transfer level: Needs assistance Equipment used: Rolling walker (2 wheeled) Transfers: Sit to/from Stand Sit to Stand: Mod assist;+2 physical assistance         General transfer comment: cues for LE management and use of UEs to self assist  Ambulation/Gait Ambulation/Gait assistance: Mod assist;+2 physical assistance Ambulation Distance (Feet): 11 Feet Assistive device: Rolling walker (2 wheeled) Gait Pattern/deviations: Step-to pattern;Decreased step length - right;Decreased step length - left;Shuffle;Trunk flexed Gait velocity: decr   General Gait Details: cues for posture, sequence and position from [employer]  Stairs            Wheelchair Mobility    Modified Rankin (Stroke Patients Only)       Balance                                             Pertinent Vitals/Pain 7/10; premed, RN aware, ice packs provided    Home Living Family/patient expects to be discharged to:: Private residence Living Arrangements: Spouse/significant other Available Help at  Discharge: Family Type of Home: House Home Access: Stairs to enter Entrance Stairs-Rails: Right Entrance Stairs-Number of Steps: 8 Home Layout: Able to live on main level with bedroom/bathroom Home Equipment: Cane - quad;Cane - single point      Prior Function Level of Independence: Independent with assistive device(s)               Hand Dominance   Dominant Hand: Right    Extremity/Trunk Assessment   Upper Extremity Assessment: Overall WFL for tasks assessed           Lower Extremity Assessment: LLE deficits/detail   LLE Deficits / Details: Hip strength 2/5 with AAROM at hip to 60 flex and 15 abd  Cervical / Trunk Assessment: Normal  Communication   Communication: No difficulties  Cognition Arousal/Alertness: Awake/alert Behavior During Therapy: WFL for tasks assessed/performed Overall Cognitive Status: Within Functional Limits for tasks assessed                      General Comments      Exercises Total Joint Exercises Ankle Circles/Pumps: AROM;15 reps;Supine;Both Heel Slides: AAROM;Left;15 reps;Supine Hip ABduction/ADduction: AAROM;Left;10 reps;Supine      Assessment/Plan    PT Assessment Patient needs continued PT services  PT Diagnosis Difficulty walking   PT Problem List Decreased strength;Decreased range of motion;Decreased activity tolerance;Decreased mobility;Decreased knowledge of use of DME;Pain  PT Treatment Interventions DME instruction;Gait training;Stair training;Functional mobility training;Therapeutic activities;Therapeutic  exercise;Patient/family education   PT Goals (Current goals can be found in the Care Plan section) Acute Rehab PT Goals Patient Stated Goal: Walk without pain PT Goal Formulation: With patient Time For Goal Achievement: 08/12/13 Potential to Achieve Goals: Good    Frequency 7X/week   Barriers to discharge        Co-evaluation               End of Session Equipment Utilized During Treatment:  Gait belt Activity Tolerance: Patient tolerated treatment well;Other (comment) (nausea) Patient left: in chair;with call bell/phone within reach;with family/visitor present Nurse Communication: Mobility status (nausea)         Time: [id number] PT Time Calculation (min): 32 min   Charges:   PT Evaluation $Initial PT Evaluation Tier I: 1 Procedure PT Treatments $Gait Training: 8-22 mins $Therapeutic Exercise: 8-22 mins   PT G Codes:          Brien FewHunter P Cypress Hinkson 08/05/2013, 5:26 PM

## 2013-08-05 NOTE — Anesthesia Postprocedure Evaluation (Signed)
  Anesthesia Post-op Note  Patient: Jesus Wagner  Procedure(s) Performed: Procedure(s) (LRB): LEFT TOTAL HIP ARTHROPLASTY ANTERIOR APPROACH (Left)  Patient Location: PACU  Anesthesia Type: Spinal  Level of Consciousness: awake and alert   Airway and Oxygen Therapy: Patient Spontanous Breathing  Post-op Pain: mild  Post-op Assessment: Post-op Vital signs reviewed, Patient's Cardiovascular Status Stable, Respiratory Function Stable, Patent Airway and No signs of Nausea or vomiting  Last Vitals:  Filed Vitals:   08/05/13 1257  BP: 123/68  Pulse: 51  Temp: 36.4 C  Resp: 12    Post-op Vital Signs: stable   Complications: He complains of left ankle, calf pain. No obvious abnormality on exam. Dr. Magnus IvanBlackman aware and has examined the patient and ordered xray and duplex to rule out dvt.

## 2013-08-05 NOTE — Progress Notes (Signed)
Utilization review completed.  

## 2013-08-05 NOTE — H&P (Signed)
TOTAL HIP ADMISSION H&P  Patient is admitted for left total hip arthroplasty.  Subjective:  Chief Complaint: left hip pain  HPI: Jesus Wagner, 78 y.o. male, has a history of pain and functional disability in the left hip(s) due to arthritis and patient has failed non-surgical conservative treatments for greater than 12 weeks to include NSAID's and/or analgesics, corticosteriod injections and activity modification.  Onset of symptoms was abrupt starting 1 years ago with rapidlly worsening course since that time.The patient noted no past surgery on the left hip(s).  Patient currently rates pain in the left hip at 10 out of 10 with activity. Patient has night pain, worsening of pain with activity and weight bearing, pain that interfers with activities of daily living and pain with passive range of motion. Patient has evidence of subchondral cysts, subchondral sclerosis and periarticular osteophytes by imaging studies. This condition presents safety issues increasing the risk of falls.  There is no current active infection.  Patient Active Problem List   Diagnosis Date Noted  . Arthritis of left hip 08/05/2013  . Varicose veins of lower extremities with other complications 03/31/2011   Past Medical History  Diagnosis Date  . Hypertension   . Prostate enlargement     Past Surgical History  Procedure Laterality Date  . Colonoscopy      [id number]  . Vasectomy    . Hernia repair      inguinal  . Laser varicose vein      Prescriptions prior to admission  Medication Sig Dispense Refill  . acetaminophen-codeine (TYLENOL #3) 300-30 MG per tablet Take 1 tablet by mouth every 4 (four) hours as needed for moderate pain.      . cholecalciferol (VITAMIN D) 1000 UNITS tablet Take 1,000 Units by mouth daily.      Marland Kitchen. HYDROcodone-acetaminophen (NORCO/VICODIN) 5-325 MG per tablet Take 1 tablet by mouth every 6 (six) hours as needed for moderate pain.      Marland Kitchen. lisinopril (PRINIVIL,ZESTRIL) 40 MG tablet Take 40  mg by mouth daily at 12 noon.       . Menthol, Topical Analgesic, (ICY HOT EX) Apply 1 application topically as needed (Muscle Pain).      . tamsulosin (FLOMAX) 0.4 MG CAPS capsule Take 0.4 mg by mouth daily. 7 days before procedure and 7 days after procedure.       Allergies  Allergen Reactions  . Aspirin     Stomach upset   . Codeine Nausea Only    History  Substance Use Topics  . Smoking status: Never Smoker   . Smokeless tobacco: Never Used  . Alcohol Use: No    No family history on file.   Review of Systems  Musculoskeletal: Positive for joint pain.  All other systems reviewed and are negative.   Objective:  Physical Exam  Constitutional: He is oriented to person, place, and time. He appears well-developed and well-nourished.  HENT:  Head: Normocephalic and atraumatic.  Eyes: EOM are normal. Pupils are equal, round, and reactive to light.  Neck: Normal range of motion. Neck supple.  Cardiovascular: Normal rate and regular rhythm.   Respiratory: Effort normal and breath sounds normal.  GI: Soft. Bowel sounds are normal.  Musculoskeletal:       Left hip: He exhibits decreased range of motion, decreased strength and bony tenderness.  Neurological: He is alert and oriented to person, place, and time.  Skin: Skin is warm and dry.  Psychiatric: He has a normal mood and affect.  Vital signs in last 24 hours: Temp:  [97.8 F (36.6 C)] 97.8 F (36.6 C) (05/15 0700) Pulse Rate:  [80] 80 (05/15 0700) Resp:  [18] 18 (05/15 0700) BP: (128)/(71) 128/71 mmHg (05/15 0700) SpO2:  [96 %] 96 % (05/15 0700)  Labs:   Estimated body mass index is 26.44 kg/(m^2) as calculated from the following:   Height as of 07/26/13: 6' (1.829 m).   Weight as of 08/11/11: 88.451 kg (195 lb).   Imaging Review Plain radiographs demonstrate moderate degenerative joint disease of the left hip(s). The bone quality appears to be good for age and reported activity level.  Assessment/Plan:  End  stage arthritis, left hip(s)  The patient history, physical examination, clinical judgement of the provider and imaging studies are consistent with end stage degenerative joint disease of the left hip(s) and total hip arthroplasty is deemed medically necessary. The treatment options including medical management, injection therapy, arthroscopy and arthroplasty were discussed at length. The risks and benefits of total hip arthroplasty were presented and reviewed. The risks due to aseptic loosening, infection, stiffness, dislocation/subluxation,  thromboembolic complications and other imponderables were discussed.  The patient acknowledged the explanation, agreed to proceed with the plan and consent was signed. Patient is being admitted for inpatient treatment for surgery, pain control, PT, OT, prophylactic antibiotics, VTE prophylaxis, progressive ambulation and ADL's and discharge planning.The patient is planning to be discharged home with home health services

## 2013-08-05 NOTE — Brief Op Note (Signed)
08/05/2013  10:59 AM  PATIENT:  Juluis Pitchobert Gunia  78 y.o. male  PRE-OPERATIVE DIAGNOSIS:  Osteoarthritis left hip  POST-OPERATIVE DIAGNOSIS:  Osteoarthritis left hip  PROCEDURE:  Procedure(s): LEFT TOTAL HIP ARTHROPLASTY ANTERIOR APPROACH (Left)  SURGEON:  Surgeon(s) and Role:    * Kathryne Hitchhristopher Y Katalin Colledge, MD - Primary  PHYSICIAN ASSISTANT: Rexene EdisonGil Clark, PA-C  ANESTHESIA:   spinal  EBL:  Total I/O In: 1000 [I.V.:1000] Out: 450 [Urine:150; Blood:300]  BLOOD ADMINISTERED:none  DRAINS: none   LOCAL MEDICATIONS USED:  NONE  SPECIMEN:  No Specimen  DISPOSITION OF SPECIMEN:  N/A  COUNTS:  YES  TOURNIQUET:  * No tourniquets in log *  DICTATION: .Other Dictation: Dictation Number [id number]  PLAN OF CARE: Admit to inpatient   PATIENT DISPOSITION:  PACU - hemodynamically stable.   Delay start of Pharmacological VTE agent (>24hrs) due to surgical blood loss or risk of bleeding: no

## 2013-08-05 NOTE — Anesthesia Preprocedure Evaluation (Addendum)
Anesthesia Evaluation  Patient identified by MRN, date of birth, ID band Patient awake    Reviewed: Allergy & Precautions, H&P , NPO status , Patient's Chart, lab work & pertinent test results  Airway Mallampati: II TM Distance: >3 FB Neck ROM: Full    Dental no notable dental hx.    Pulmonary neg pulmonary ROS,  breath sounds clear to auscultation  Pulmonary exam normal       Cardiovascular Exercise Tolerance: Good hypertension, Pt. on medications + Peripheral Vascular Disease Rhythm:Regular Rate:Normal     Neuro/Psych negative neurological ROS  negative psych ROS   GI/Hepatic negative GI ROS, Neg liver ROS,   Endo/Other  negative endocrine ROS  Renal/GU negative Renal ROS  negative genitourinary   Musculoskeletal negative musculoskeletal ROS (+)   Abdominal   Peds negative pediatric ROS (+)  Hematology negative hematology ROS (+)   Anesthesia Other Findings   Reproductive/Obstetrics negative OB ROS                          Anesthesia Physical Anesthesia Plan  ASA: II  Anesthesia Plan: Spinal   Post-op Pain Management:    Induction: Intravenous  Airway Management Planned:   Additional Equipment:   Intra-op Plan:   Post-operative Plan: Extubation in OR  Informed Consent: I have reviewed the patients History and Physical, chart, labs and discussed the procedure including the risks, benefits and alternatives for the proposed anesthesia with the patient or authorized representative who has indicated his/her understanding and acceptance.   Dental advisory given  Plan Discussed with: CRNA  Anesthesia Plan Comments: (Discussed general and spinal. He prefers spinal. Discussed risks/benefits of spinal including headache, backache, failure, bleeding, infection, and nerve damage. Patient consents to spinal. Questions answered. Coagulation studies and platelet count acceptable.)         Anesthesia Quick Evaluation

## 2013-08-06 LAB — BASIC METABOLIC PANEL
BUN: 14 mg/dL (ref 6–23)
CHLORIDE: 101 meq/L (ref 96–112)
CO2: 28 mEq/L (ref 19–32)
Calcium: 8.8 mg/dL (ref 8.4–10.5)
Creatinine, Ser: 0.86 mg/dL (ref 0.50–1.35)
GFR, EST NON AFRICAN AMERICAN: 82 mL/min — AB (ref >90)
Glucose, Bld: 97 mg/dL (ref 70–99)
POTASSIUM: 5 meq/L (ref 3.7–5.3)
SODIUM: 136 meq/L — AB (ref 137–147)

## 2013-08-06 LAB — CBC
HEMATOCRIT: 33.7 % — AB (ref 39.0–52.0)
Hemoglobin: 12 g/dL — ABNORMAL LOW (ref 13.0–17.0)
MCH: 31.8 pg (ref 26.0–34.0)
MCHC: 35.6 g/dL (ref 30.0–36.0)
MCV: 89.4 fL (ref 78.0–100.0)
PLATELETS: 130 10*3/uL — AB (ref 150–400)
RBC: 3.77 MIL/uL — ABNORMAL LOW (ref 4.22–5.81)
RDW: 12.2 % (ref 11.5–15.5)
WBC: 6.3 10*3/uL (ref 4.0–10.5)

## 2013-08-06 MED ORDER — RIVAROXABAN 10 MG PO TABS: 10.0000 mg | ORAL_TABLET | Freq: Every day | ORAL | Status: DC

## 2013-08-06 MED ORDER — OXYCODONE-ACETAMINOPHEN 5-325 MG PO TABS: 1.0000 | ORAL_TABLET | ORAL | Status: DC | PRN

## 2013-08-06 NOTE — Progress Notes (Signed)
Subjective: Pt stable - c/o heel pain - some burning on left op side   Objective: Vital signs in last 24 hours: Temp:  [97.3 F (36.3 C)-100 F (37.8 C)] 100 F (37.8 C) (05/16 0655) Pulse Rate:  [51-86] 78 (05/16 0655) Resp:  [11-18] 16 (05/16 0655) BP: (113-133)/(53-68) 133/60 mmHg (05/16 0655) SpO2:  [95 %-100 %] 97 % (05/16 0655) Weight:  [86.6 kg (190 lb 14.7 oz)] 86.6 kg (190 lb 14.7 oz) (05/15 1300)  Intake/Output from previous day: 05/15 0701 - 05/16 0700 In: 3348.8 [P.O.:480; I.V.:2703.8; IV Piggyback:165] Out: 900 [Urine:600; Blood:300] Intake/Output this shift:    Exam:  Sensation intact distally Intact pulses distally Dorsiflexion/Plantar flexion intact  Labs:  Recent Labs  08/06/13 0530  HGB 12.0*    Recent Labs  08/06/13 0530  WBC 6.3  RBC 3.77*  HCT 33.7*  PLT 130*    Recent Labs  08/06/13 0530  NA 136*  K 5.0  CL 101  CO2 28  BUN 14  CREATININE 0.86  GLUCOSE 97  CALCIUM 8.8   No results found for this basename: LABPT, INR,  in the last 72 hours  Assessment/Plan: Pt stable - heel pain may be traction neuropraxia on top of neuropathy or atypical plantar fasciitis - no calf tenderness - woke from or with increase in sxs - observe for now   [employer]TEL Corporationregory Scott Burke Terry 08/06/2013, 7:55 AM

## 2013-08-06 NOTE — Evaluation (Addendum)
Occupational Therapy Evaluation Patient Details Name: Jesus PitchRobert Wagner MRN: [id number] DOB: 15-Aug-1935 Today's Date: 08/06/2013    History of Present Illness L THR   Clinical Impression   Pt demonstrates decline in function with ADLs and ADL mobility safety with decreased balance and endurance. Pt would benefit from acute OT services to address impairments to increase level of function and safety    Follow Up Recommendations  Home health OT;Supervision/Assistance - 24 hour    Equipment Recommendations  Tub/shower seat, 3 in 1    Recommendations for Other Services       Precautions / Restrictions Precautions Precautions: Fall Restrictions Weight Bearing Restrictions: No Other Position/Activity Restrictions: WBAT      Mobility Bed Mobility Overal bed mobility: Needs Assistance Bed Mobility: Supine to Sit;Sit to Supine     Supine to sit: Min assist Sit to supine: Mod assist      Transfers Overall transfer level: Needs assistance Equipment used: Rolling walker (2 wheeled) Transfers: Sit to/from Stand Sit to Stand: Min assist         General transfer comment: cues for hand placement    Balance Overall balance assessment: Needs assistance Sitting-balance support: No upper extremity supported;Feet supported Sitting balance-Leahy Scale: Good     Standing balance support: Single extremity supported;Bilateral upper extremity supported;During functional activity Standing balance-Leahy Scale: Fair                              ADL Overall ADL's : Needs assistance/impaired     Grooming: Wash/dry hands;Wash/dry face;Standing;Min guard   Upper Body Bathing: Set up;Sitting   Lower Body Bathing: Moderate assistance;Sit to/from stand;Sitting/lateral leans   Upper Body Dressing : Set up;Sitting   Lower Body Dressing: Maximal assistance;Sit to/from stand;Sitting/lateral leans   Toilet Transfer: Minimal assistance;Grab bars;RW;Comfort height  toilet Toilet Transfer Details (indicate cue type and reason): cues for hand placement. 3 in 1 over toilet Toileting- Clothing Manipulation and Hygiene: Moderate assistance;Sit to/from stand       Functional mobility during ADLs: Minimal assistance;Cueing for safety General ADL Comments: Pt and wife educated on uuse of shower chair/3 in 1 at home and ADL A/E     Vision  wears glasses at all times                            Pertinent Vitals/Pain 1/10 L hip pain, 6-10/10 L heel pain. VSS     Hand Dominance Right   Extremity/Trunk Assessment Upper Extremity Assessment Upper Extremity Assessment: Overall WFL for tasks assessed   Lower Extremity Assessment Lower Extremity Assessment: Defer to PT evaluation   Cervical / Trunk Assessment Cervical / Trunk Assessment: Normal   Communication Communication Communication: No difficulties   Cognition Arousal/Alertness: Awake/alert Behavior During Therapy: WFL for tasks assessed/performed Overall Cognitive Status: Within Functional Limits for tasks assessed                     General Comments   Pt and wife very pleasant and cooperative                 Home Living Family/patient expects to be discharged to:: Private residence Living Arrangements: Spouse/significant other Available Help at Discharge: Family Type of Home: House Home Access: Stairs to enter Secretary/administratorntrance Stairs-Number of Steps: 8 Entrance Stairs-Rails: Right Home Layout: Able to live on main level with bedroom/bathroom     Bathroom Shower/Tub: Walk-in shower;Tub/shower  unit   Bathroom Toilet: Standard     Home Equipment: Cane - quad;Cane - single point          Prior Functioning/Environment Level of Independence: Independent with assistive device(s)             OT Diagnosis: Acute pain   OT Problem List: Decreased knowledge of use of DME or AE;Decreased activity tolerance;Pain;Impaired balance (sitting and/or standing)   OT  Treatment/Interventions: Self-care/ADL training;Therapeutic exercise;Patient/family education;Neuromuscular education;Balance training;Therapeutic activities;DME and/or AE instruction    OT Goals(Current goals can be found in the care plan section) Acute Rehab OT Goals Patient Stated Goal: Walk without pain OT Goal Formulation: With patient/family Time For Goal Achievement: 08/13/13 Potential to Achieve Goals: Good ADL Goals Pt Will Perform Grooming: with set-up;with supervision;standing Pt Will Perform Lower Body Bathing: with min assist;with caregiver independent in assisting;with adaptive equipment;sitting/lateral leans;sit to/from stand Pt Will Perform Lower Body Dressing: with mod assist;with caregiver independent in assisting;sitting/lateral leans;sit to/from stand;with adaptive equipment Pt Will Transfer to Toilet: with min guard assist;with supervision;ambulating;grab bars (3 in 1) Pt Will Perform Toileting - Clothing Manipulation and hygiene: with min assist;with min guard assist;sit to/from stand Pt Will Perform Tub/Shower Transfer: with min assist;with min guard assist;shower seat  OT Frequency: Min 2X/week   Barriers to D/C:    none, wife can provide 24 hr care                     End of Session Equipment Utilized During Treatment: Gait belt;Rolling walker;Other (comment) (3 in 1 over toilet)  Activity Tolerance: Patient tolerated treatment well Patient left: in bed;with call bell/phone within reach;with family/visitor present   Time: [id number] OT Time Calculation (min): 28 min Charges:  OT General Charges $OT Visit: 1 Procedure OT Evaluation $Initial OT Evaluation Tier I: 1 Procedure OT Treatments $Therapeutic Activity: 8-22 mins G-Codes:    Lafe GarinDenise J Loreley Wagner 08/06/2013, 12:55 PM

## 2013-08-06 NOTE — Progress Notes (Signed)
Physical Therapy Treatment Patient Details Name: Jesus PitchRobert Wagner MRN: [id number] DOB: 17-Jun-1935 Today's Date: 08/06/2013    History of Present Illness L THR    PT Comments    Pt progressing well with therex and ambulating in halls as well as to/from bathroom.    Follow Up Recommendations  Home health PT     Equipment Recommendations  Rolling walker with 5" wheels    Recommendations for Other Services OT consult     Precautions / Restrictions Precautions Precautions: Fall Restrictions Weight Bearing Restrictions: No Other Position/Activity Restrictions: WBAT    Mobility  Bed Mobility Overal bed mobility: Needs Assistance Bed Mobility: Supine to Sit;Sit to Supine     Supine to sit: Min assist;Mod assist Sit to supine: Min assist;Mod assist   General bed mobility comments: cues for sequencing and use of R LE to self assist  Transfers Overall transfer level: Needs assistance Equipment used: Rolling walker (2 wheeled) Transfers: Sit to/from Stand Sit to Stand: Min assist;Mod assist         General transfer comment: cues for LE management and use of UEs to self assist  Ambulation/Gait Ambulation/Gait assistance: Min assist;Mod assist Ambulation Distance (Feet): 42 Feet (and 20' twice ) Assistive device: Rolling walker (2 wheeled) Gait Pattern/deviations: Step-to pattern;Decreased step length - right;Decreased step length - left;Shuffle;Trunk flexed Gait velocity: decr   General Gait Details: cues for posture, sequence and position from [facility] [employer]   Stairs            Wheelchair Mobility    Modified Rankin (Stroke Patients Only)       Balance                                    Cognition Arousal/Alertness: Awake/alert Behavior During Therapy: WFL for tasks assessed/performed Overall Cognitive Status: Within Functional Limits for tasks assessed                      Exercises Total Joint Exercises Ankle Circles/Pumps: AROM;15  reps;Supine;Both Quad Sets: AROM;Both;10 reps;Supine Heel Slides: AAROM;Left;15 reps;Supine Hip ABduction/ADduction: AAROM;Left;Supine;15 reps    General Comments        Pertinent Vitals/Pain 5/10; premed, ice packs provided    Home Living                      Prior Function            PT Goals (current goals can now be found in the care plan section) Acute Rehab PT Goals Patient Stated Goal: Walk without pain PT Goal Formulation: With patient Time For Goal Achievement: 08/12/13 Potential to Achieve Goals: Good Progress towards PT goals: Progressing toward goals    Frequency  7X/week    PT Plan Current plan remains appropriate    Co-evaluation             End of Session Equipment Utilized During Treatment: Gait belt Activity Tolerance: Patient tolerated treatment well;Other (comment) (nausea - RN aware) Patient left: in bed;with call bell/phone within reach;with family/visitor present     Time: [id number] PT Time Calculation (min): 47 min  Charges:  $Gait Training: 8-22 mins $Therapeutic Exercise: 8-22 mins $Therapeutic Activity: 8-22 mins                    G Codes:      Brien FewHunter P Orley Lawry 08/06/2013, 12:03 PM

## 2013-08-06 NOTE — Progress Notes (Signed)
Physical Therapy Treatment Patient Details Name: Jesus Wagner MRN: [id number] DOB: [birth year] Today's Date: 08/06/2013    History of Present Illness L THR    PT Comments      Follow Up Recommendations  Home health PT     Equipment Recommendations  Rolling walker with 5" wheels    Recommendations for Other Services OT consult     Precautions / Restrictions Precautions Precautions: Fall Restrictions Weight Bearing Restrictions: No Other Position/Activity Restrictions: WBAT    Mobility  Bed Mobility Overal bed mobility: Needs Assistance Bed Mobility: Supine to Sit     Supine to sit: Min assist Sit to supine: Mod assist   General bed mobility comments: cues for sequencing and use of R LE to self assist  Transfers Overall transfer level: Needs assistance Equipment used: Rolling walker (2 wheeled) Transfers: Sit to/from Stand Sit to Stand: Min assist         General transfer comment: cues for hand placement  Ambulation/Gait Ambulation/Gait assistance: Min assist Ambulation Distance (Feet): 111 Feet Assistive device: Rolling walker (2 wheeled) Gait Pattern/deviations: Step-to pattern;Decreased step length - right;Decreased step length - left;Shuffle;Trunk flexed Gait velocity: decr   General Gait Details: cues for posture, sequence and position from [facility] [employer]   Stairs            Wheelchair Mobility    Modified Rankin (Stroke Patients Only)       Balance Overall balance assessment: Needs assistance Sitting-balance support: No upper extremity supported;Feet supported Sitting balance-Leahy Scale: Good     Standing balance support: Single extremity supported;Bilateral upper extremity supported;During functional activity Standing balance-Leahy Scale: Fair                      Cognition Arousal/Alertness: Awake/alert Behavior During Therapy: WFL for tasks assessed/performed Overall Cognitive Status: Within Functional Limits for tasks  assessed                      Exercises      General Comments        Pertinent Vitals/Pain 3/10; ice packs provided    Home Living Family/patient expects to be discharged to:: Private residence Living Arrangements: Spouse/significant other Available Help at Discharge: Family Type of Home: House Home Access: Stairs to enter Entrance Stairs-Rails: Right Home Layout: Able to live on main level with bedroom/bathroom Home Equipment: Cane - quad;Cane - single point      Prior Function Level of Independence: Independent with assistive device(s)          PT Goals (current goals can now be found in the care plan section) Acute Rehab PT Goals Patient Stated Goal: Walk without pain PT Goal Formulation: With patient Time For Goal Achievement: 08/12/13 Potential to Achieve Goals: Good Progress towards PT goals: Progressing toward goals    Frequency  7X/week    PT Plan Current plan remains appropriate    Co-evaluation             End of Session Equipment Utilized During Treatment: Gait belt Activity Tolerance: Patient tolerated treatment well;Other (comment) Patient left: in chair;with call bell/phone within reach;with family/visitor present     Time: [id number] PT Time Calculation (min): 23 min  Charges:  $Gait Training: 23-37 mins                    G Codes:      Brien FewHunter P Coby [place]sberry 08/06/2013, 4:13 PM

## 2013-08-06 NOTE — Op Note (Signed)
NAMHorald Pollen:  Dollens, Brenon               ACCOUNT NO.:  [id number]  MEDICAL RECORD NO.:  [id number]  LOCATION:  1612                         [facility]  PHYSICIAN:  Vanita PandaChristopher Y. Magnus IvanBlackman, M.D.DATE OF BIRTH:  05/24/1935  DATE OF PROCEDURE:  08/05/2013 DATE OF DISCHARGE:                              OPERATIVE REPORT   PREOPERATIVE DIAGNOSIS:  Moderate painful arthritis, left hip.  POSTOPERATIVE DIAGNOSIS:  Moderate-to-severe arthritis, left hip.  PROCEDURE:  Left total hip arthroplasty through direct anterior approach.  IMPLANTS:  DePuy Sector Gription acetabular component size 54, size 36+ 4 neutral polyethylene liner, size 12 Corail femoral component with standard offset, size 36+ 5 ceramic hip ball.  SURGEON:  Vanita PandaChristopher Y. Magnus IvanBlackman, M.D.  ASSISTANT:  Richardean CanalGilbert Clark, PA-C.  ANESTHESIA:  Spinal.  BLOOD LOSS:  __________ 350 mL.  ANTIBIOTICS:  2 g IV Ancef.  COMPLICATIONS:  None.  INDICATIONS:  Mr. Jesus Wagner is a 78 year old gentleman, well known to me. Over a year ago, he had left hip pain and an MRI was obtained and showed a significant stress fracture in the femoral head.  He has had a series of injections in his hip and finally it had gotten better, but over the last several months, it has gotten progressively worse in terms of the pain he is having in his left hip.  This was becoming debilitating and another injection did not help.  MRI findings showed mild-to-moderate arthritic changes, but on clinical exam it definitely points to his hip and his pain has gotten to where it is affecting his mobility, his activities of daily living, and his quality of life.  At this point, he wished to proceed with a total hip arthroplasty.  He understands the risks of acute blood loss anemia, nerve and vessel injury, fracture, infection, dislocation, DVT.  He understands the goals are decreased pain, improve mobility, and overall improve quality of life.  I did discuss his case  preoperatively with his primary care physician, [facility] Cre[facility] and we collectively agree to proceed to the operating room for the surgery.  Mr. Jesus Wagner understands fully the risks and benefits of what is involved and informed consent was obtained.  PROCEDURE DESCRIPTION:  After informed consent was obtained, appropriate left hip was marked.  He was brought to the operating room.  While he was on a stretcher, spinal anesthesia was obtained, and a Foley catheter was placed.  Both feet had traction boots applied to them and next he was placed supine on the Hana fracture table with the perineal post in place and both legs in inline skeletal traction devices, but no traction applied.  His left operative hip was then prepped and draped with DuraPrep and sterile drapes.  A time-out was called to identify correct patient, correct left hip.  I then made an incision just distal and inferior to the anterosuperior iliac spine and carried this obliquely down the leg.  I dissected down the tensor fascia lata muscle and the tensor fascia was divided longitudinally.  I proceeded with a direct anterior approach to the hip.  A Cobra retractor was placed around the lateral neck and up underneath the rectus femoris around the  medial neck.  I cauterized the lateral femoral circumflex vessels and of note, we did find significant effusion in the joint since we opened up the hip capsule.  There were loose bodies as well.  I then made my femoral neck cut with the oscillating saw and completed this on osteotome just proximal to the lesser trochanter.  I placed a corkscrew guide on the femoral head and removed the femoral head in its entirety.  We found cartilage flaking off in several areas and areas of significant arthritic changes that were much worse than on the MRI and plain films. We then cleaned the acetabulum debris and placed a bent Hohmann medially and a Cobra retractor  behind the posterior rim.  I cleaned the remnants of the labrum from the acetabulum and began reaming from a size 42 reamer in 2 mm increments up to a size 54 with all reamers under direct visualization.  Last reamer also under direct fluoroscopy so we could obtain our depth of reaming, inclination and anteversion.  Once I was pleased with this, I placed the real DePuy Sector Gription acetabular head size 54.  An apex hole eliminator guide and a 36+ 4 neutral polyethylene liner for a 56 cup.  We then externally rotated the femur to about 100 degrees, extended and adducted it.  I placed a Mueller retractor medially and Hohmann retractor behind the greater trochanter. I used a box cutting osteotome to enter the femoral canal.  We released the lateral joint capsule as well.  I then used a rongeur to lateralize and began broaching from a size 8 broach using Corail broaching system up to a size 12.  I was pleased with __________ canal of size 12, so we trailed a standard neck and a 36+ 1.5 hip ball.  We brought the leg back over and up with traction and internal rotation, reducing the pelvis and it was stable throughout its arc of rotation, but it did show that it was just a little short.  We then dislocated the hip and removed the trial components.  I placed the real Corail femoral component size 12 with standard offset and the real 36+ 5 ceramic hip ball, reduced this back in the acetabulum and this was stable.  His leg lengths were looked at and also looked near equal at this point.  We then copiously irrigated the soft tissue with normal saline solution.  We closed the joint capsule with interrupted #1 Ethibond suture followed by running #1 Vicryl in the tensor fascia, 0 Vicryl the deep tissue, 2-0 Vicryl in subcutaneous tissue, 4-0 Monocryl subcuticular stitch, Steri-Strips on the skin.  An Aquacel dressing was applied.  He was taken off the Hana table to the recovery room in stable  condition.  All final counts were correct and there were no complications noted.  Of note, Richardean CanalGilbert Clark, [facility] assisted throughout the entire case and his assistance was crucial for facilitating this case and getting it completed.     Vanita Pandahristopher Y. Magnus IvanBlackman, M.D.     CYB/MEDQ  D:  08/05/2013  T:  08/06/2013  Job:  [id number]

## 2013-08-06 NOTE — Discharge Instructions (Signed)
Increase your activities as comfort allows. Full weight as tolerated on your left hip. You can leave your current dressing on for the next 6 days. You can get your current dressing wet in the shower. On 5/22 you can remove your dressings and start getting your actual incision wet; then new dry dressing daily.  Information on my medicine - XARELTO (Rivaroxaban)  This medication education was reviewed with me or my healthcare representative as part of my discharge preparation.  The pharmacist that spoke with me during my hospital stay was:  Otho Bellowserri L Sholonda Jobst, [facility]  Why was Xarelto prescribed for you? Xarelto was prescribed for you to reduce the risk of blood clots forming after orthopedic surgery. The medical term for these abnormal blood clots is venous thromboembolism (VTE).  What do you need to know about xarelto ? Take your Xarelto ONCE DAILY at the same time every day. You may take it either with or without food.  If you have difficulty swallowing the tablet whole, you may crush it and mix in applesauce just prior to taking your dose.  Take Xarelto exactly as prescribed by your doctor and DO NOT stop taking Xarelto without talking to the doctor who prescribed the medication.  Stopping without other VTE prevention medication to take the place of Xarelto may increase your risk of developing a clot.  After discharge, you should have regular check-up appointments with your healthcare provider that is prescribing your Xarelto.    What do you do if you miss a dose? If you miss a dose, take it as soon as you remember on the same day then continue your regularly scheduled once daily regimen the next day. Do not take two doses of Xarelto on the same day.   Important Safety Information A possible side effect of Xarelto is bleeding. You should call your healthcare provider right away if you experience any of the following:   Bleeding from an injury or your nose that does not stop.   Unusual colored urine (red or dark brown) or unusual colored stools (red or black).   Unusual bruising for unknown reasons.   A serious fall or if you hit your head (even if there is no bleeding).  Some medicines may interact with Xarelto and might increase your risk of bleeding while on Xarelto. To help avoid this, consult your healthcare provider or pharmacist prior to using any new prescription or non-prescription medications, including herbals, vitamins, non-steroidal anti-inflammatory drugs (NSAIDs) and supplements.  This website has more information on Xarelto: [email or web address].xarelto.[email or web address].

## 2013-08-07 ENCOUNTER — Inpatient Hospital Stay (HOSPITAL_COMMUNITY): Payer: Medicare Other

## 2013-08-07 LAB — CBC
HEMATOCRIT: 30.6 % — AB (ref 39.0–52.0)
Hemoglobin: 11.1 g/dL — ABNORMAL LOW (ref 13.0–17.0)
MCH: 32 pg (ref 26.0–34.0)
MCHC: 36.3 g/dL — AB (ref 30.0–36.0)
MCV: 88.2 fL (ref 78.0–100.0)
Platelets: 123 10*3/uL — ABNORMAL LOW (ref 150–400)
RBC: 3.47 MIL/uL — AB (ref 4.22–5.81)
RDW: 12.2 % (ref 11.5–15.5)
WBC: 5.8 10*3/uL (ref 4.0–10.5)

## 2013-08-07 NOTE — Progress Notes (Signed)
Subjective: Pt doing well with hip but continues to report left foot pain   Objective: Vital signs in last 24 hours: Temp:  [97.7 F (36.5 C)-98.7 F (37.1 C)] 98.7 F (37.1 C) (05/17 1505) Pulse Rate:  [80-87] 82 (05/17 1505) Resp:  [18-20] 18 (05/17 1505) BP: (119-124)/(60-67) 124/62 mmHg (05/17 1505) SpO2:  [98 %-99 %] 99 % (05/17 1505)  Intake/Output from previous day: 05/16 0701 - 05/17 0700 In: 1898.8 [P.O.:840; I.V.:1058.8] Out: 2125 [Urine:2125] Intake/Output this shift: Total I/O In: 120 [P.O.:120] Out: 625 [Urine:625]  Exam:  left foot diffusely tender no swelling motor ok no calf tenderness  Labs:  Recent Labs  08/06/13 0530 08/07/13 0530  HGB 12.0* 11.1*    Recent Labs  08/06/13 0530 08/07/13 0530  WBC 6.3 5.8  RBC 3.77* 3.47*  HCT 33.7* 30.6*  PLT 130* 123*    Recent Labs  08/06/13 0530  NA 136*  K 5.0  CL 101  CO2 28  BUN 14  CREATININE 0.86  GLUCOSE 97  CALCIUM 8.8   No results found for this basename: LABPT, INR,  in the last 72 hours  Assessment/Plan: Plan xray foot and ankle dc home am    Jesus Wagner 08/07/2013, 4:03 PM

## 2013-08-07 NOTE — Progress Notes (Signed)
Pt has c/o left foot pain past 24hrs,various areas ,left upper foot,left lateral foot last night,now c/o bottom of left foot pad and heel area pain.Left foot xray negative CMS + denies numbness,tingling,2 + PP w brisk cap refill .Repositioned with heels off bed and gripper footies removed for hs.Denies any left hip pain,and states ready for d/c home tomorrow.Doren Custardynthia D Mclain Freer

## 2013-08-07 NOTE — Care Management Note (Signed)
Cm spoke with patient st the bedside concerning discharge planning. [facility] pt [facility]. [facility] to provide dme. Per pt agency has contacted spouse concerning delivery of equipment. No other needs assessed at this time.   Roxy Mannsymeeka Naiya Corral,MSN,RN [phone]

## 2013-08-07 NOTE — Progress Notes (Signed)
Physical Therapy Treatment Patient Details Name: Jesus Wagner MRN: [id number] DOB: [birth year] Today's Date: 08/07/2013    History of Present Illness L THR    PT Comments    Progressing well  Follow Up Recommendations  Home health PT     Equipment Recommendations  Rolling walker with 5" wheels    Recommendations for Other Services OT consult     Precautions / Restrictions Precautions Precautions: Fall Restrictions Weight Bearing Restrictions: No Other Position/Activity Restrictions: WBAT    Mobility  Bed Mobility Overal bed mobility: Needs Assistance Bed Mobility: Supine to Sit     Supine to sit: Min guard     General bed mobility comments: cues for sequencing and use of R LE to self assist  Transfers Overall transfer level: Needs assistance Equipment used: Rolling walker (2 wheeled) Transfers: Sit to/from Stand Sit to Stand: Min guard         General transfer comment: cues for hand placement  Ambulation/Gait Ambulation/Gait assistance: Min guard Ambulation Distance (Feet): 150 Feet (twice) Assistive device: Rolling walker (2 wheeled) Gait Pattern/deviations: Step-to pattern;Step-through pattern;Shuffle     General Gait Details: cues for posture, sequence and position from RW; progressed to recip gait   Stairs Stairs: Yes Stairs assistance: Min assist Stair Management: One rail Left;Step to pattern;Forwards;With cane Number of Stairs: 4 General stair comments: cues for sequence and foot/crutch placement  Wheelchair Mobility    Modified Rankin (Stroke Patients Only)       Balance                                    Cognition Arousal/Alertness: Awake/alert Behavior During Therapy: WFL for tasks assessed/performed Overall Cognitive Status: Within Functional Limits for tasks assessed                      Exercises      General Comments        Pertinent Vitals/Pain 3/10 hip; 5/10 foot, premed, ice packs  provided    Home Living                      Prior Function            PT Goals (current goals can now be found in the care plan section) Acute Rehab PT Goals Patient Stated Goal: Walk without pain PT Goal Formulation: With patient Time For Goal Achievement: 08/12/13 Potential to Achieve Goals: Good Progress towards PT goals: Progressing toward goals    Frequency  7X/week    PT Plan Current plan remains appropriate    Co-evaluation             End of Session Equipment Utilized During Treatment: Gait belt Activity Tolerance: Patient tolerated treatment well Patient left: in chair;with call bell/phone within reach;with family/visitor present     Time: [id number] PT Time Calculation (min): 23 min  Charges:  $Gait Training: 8-22 mins $Therapeutic Activity: 8-22 mins                    G Codes:      Jesus Wagner 08/07/2013, 2:27 PM

## 2013-08-07 NOTE — Progress Notes (Signed)
Physical Therapy Treatment Patient Details Name: Jesus PitchRobert Wagner MRN: [id number] DOB: 18-Apr-1935 Today's Date: 08/07/2013    History of Present Illness L THR    PT Comments    Pt progressing well with marked improvement in ambulatory stability  Follow Up Recommendations  Home health PT     Equipment Recommendations  Rolling walker with 5" wheels    Recommendations for Other Services OT consult     Precautions / Restrictions Precautions Precautions: Fall Restrictions Weight Bearing Restrictions: No Other Position/Activity Restrictions: WBAT    Mobility  Bed Mobility Overal bed mobility: Needs Assistance Bed Mobility: Sit to Supine       Sit to supine: Min assist   General bed mobility comments: cues for sequencing and use of R LE to self assist  Transfers Overall transfer level: Needs assistance Equipment used: Rolling walker (2 wheeled) Transfers: Sit to/from Stand Sit to Stand: Min guard         General transfer comment: cues for hand placement  Ambulation/Gait Ambulation/Gait assistance: Min guard Ambulation Distance (Feet): 200 Feet Assistive device: Rolling walker (2 wheeled) Gait Pattern/deviations: Step-to pattern;Step-through pattern;Shuffle;Trunk flexed     General Gait Details: cues for posture, sequence and position from RW; progressed to recip gait   Stairs            Wheelchair Mobility    Modified Rankin (Stroke Patients Only)       Balance                                    Cognition Arousal/Alertness: Awake/alert Behavior During Therapy: WFL for tasks assessed/performed Overall Cognitive Status: Within Functional Limits for tasks assessed                      Exercises Total Joint Exercises Ankle Circles/Pumps: AROM;15 reps;Supine;Both Quad Sets: AROM;Both;10 reps;Supine Gluteal Sets: AROM;Both;10 reps;Supine Heel Slides: AAROM;Left;Supine;20 reps Hip ABduction/ADduction: AAROM;Left;Supine;15  reps    General Comments        Pertinent Vitals/Pain 3/10 hip, 5/10 foot; premed, ice packs provided    Home Living                      Prior Function            PT Goals (current goals can now be found in the care plan section) Acute Rehab PT Goals Patient Stated Goal: Walk without pain PT Goal Formulation: With patient Time For Goal Achievement: 08/12/13 Potential to Achieve Goals: Good Progress towards PT goals: Progressing toward goals    Frequency  7X/week    PT Plan Current plan remains appropriate    Co-evaluation             End of Session Equipment Utilized During Treatment: Gait belt Activity Tolerance: Patient tolerated treatment well Patient left: in bed;with call bell/phone within reach     Time: 0900-0940 PT Time Calculation (min): 40 min  Charges:  $Gait Training: 23-37 mins $Therapeutic Exercise: 8-22 mins                    G Codes:      Brien FewHunter P [name] 08/07/2013, 12:09 PM

## 2013-08-08 LAB — CBC
HCT: 27.9 % — ABNORMAL LOW (ref 39.0–52.0)
Hemoglobin: 10 g/dL — ABNORMAL LOW (ref 13.0–17.0)
MCH: 31.4 pg (ref 26.0–34.0)
MCHC: 35.8 g/dL (ref 30.0–36.0)
MCV: 87.7 fL (ref 78.0–100.0)
PLATELETS: 116 10*3/uL — AB (ref 150–400)
RBC: 3.18 MIL/uL — AB (ref 4.22–5.81)
RDW: 11.8 % (ref 11.5–15.5)
WBC: 5.1 10*3/uL (ref 4.0–10.5)

## 2013-08-08 MED ORDER — ONDANSETRON 4 MG PO TBDP: 4.0000 mg | ORAL_TABLET | Freq: Four times a day (QID) | ORAL | Status: DC | PRN

## 2013-08-08 NOTE — Discharge Summary (Signed)
Patient ID: Jesus Wagner MRN: [id number] DOB/AGE: 08-14-1935 78 y.o.  Admit date: 08/05/2013 Discharge date: 08/08/2013  Admission Diagnoses:  Principal Problem:   Arthritis of left hip Active Problems:   Status post THR (total hip replacement)   Discharge Diagnoses:  Same  Past Medical History  Diagnosis Date  . Hypertension   . Prostate enlargement     Surgeries: Procedure(s): LEFT TOTAL HIP ARTHROPLASTY ANTERIOR APPROACH on 08/05/2013   Consultants:    Discharged Condition: Improved  Hospital Course: Alf Doyle is an 78 y.o. male who was admitted 08/05/2013 for operative treatment ofArthritis of left hip. Patient has severe unremitting pain that affects sleep, daily activities, and work/hobbies. After pre-op clearance the patient was taken to the operating room on 08/05/2013 and underwent  Procedure(s): LEFT TOTAL HIP ARTHROPLASTY ANTERIOR APPROACH.    Patient was given perioperative antibiotics: Anti-infectives   Start     Dose/Rate Route Frequency Ordered Stop   08/05/13 1600  ceFAZolin (ANCEF) IVPB 1 g/50 mL premix     1 g 100 mL/hr over 30 Minutes Intravenous Every 6 hours 08/05/13 1319 08/05/13 2143   08/05/13 0656  ceFAZolin (ANCEF) IVPB 2 g/50 mL premix     2 g 100 mL/hr over 30 Minutes Intravenous On call to O.R. 08/05/13 4098 08/05/13 0949       Patient was given sequential compression devices, early ambulation, and chemoprophylaxis to prevent DVT.  Patient benefited maximally from hospital stay and there were no complications.    Recent vital signs: Patient Vitals for the past 24 hrs:  BP Temp Temp src Pulse Resp SpO2  08/08/13 0556 129/74 mmHg 98.4 F (36.9 C) Oral 81 18 100 %  08/07/13 2141 149/72 mmHg 98.4 F (36.9 C) Oral 73 19 100 %  08/07/13 1505 124/62 mmHg 98.7 F (37.1 C) Oral 82 18 99 %     Recent laboratory studies:  Recent Labs  08/06/13 0530 08/07/13 0530 08/08/13 0439  WBC 6.3 5.8 5.1  HGB 12.0* 11.1* 10.0*  HCT 33.7* 30.6*  27.9*  PLT 130* 123* 116*  NA 136*  --   --   K 5.0  --   --   CL 101  --   --   CO2 28  --   --   BUN 14  --   --   CREATININE 0.86  --   --   GLUCOSE 97  --   --   CALCIUM 8.8  --   --      Discharge Medications:     Medication List    STOP taking these medications       HYDROcodone-acetaminophen 5-325 MG per tablet  Commonly known as:  NORCO/VICODIN     ICY HOT EX      TAKE these medications       cholecalciferol 1000 UNITS tablet  Commonly known as:  VITAMIN D  Take 1,000 Units by mouth daily.     lisinopril 40 MG tablet  Commonly known as:  PRINIVIL,ZESTRIL  Take 40 mg by mouth daily at 12 noon.     ondansetron 4 MG disintegrating tablet  Commonly known as:  ZOFRAN ODT  Take 1 tablet (4 mg total) by mouth 4 (four) times daily as needed for nausea or vomiting.     oxyCODONE-acetaminophen 5-325 MG per tablet  Commonly known as:  ROXICET  Take 1-2 tablets by mouth every 4 (four) hours as needed for severe pain.     rivaroxaban 10 MG Tabs tablet  Commonly known as:  XARELTO  Take 1 tablet (10 mg total) by mouth daily with breakfast.     tamsulosin 0.4 MG Caps capsule  Commonly known as:  FLOMAX  Take 0.4 mg by mouth daily. 7 days before procedure and 7 days after procedure.        Diagnostic Studies: Dg Chest 2 View  07/26/2013   CLINICAL DATA:  Preop left hip arthroplasty  EXAM: CHEST  2 VIEW  COMPARISON:  None.  FINDINGS: The heart size and mediastinal contours are within normal limits. Both lungs are clear. The visualized skeletal structures are unremarkable.  IMPRESSION: No active cardiopulmonary disease.   Electronically Signed   By: Marlan Palauharles  Clark M.D.   On: 07/26/2013 16:08   Dg Hip Complete Left  08/05/2013   CLINICAL DATA:  Left hip arthroplasty.  EXAM: DG C-ARM 1-60 MIN - NRPT [facility]; LEFT HIP - COMPLETE 2+ VIEW  COMPARISON:  None.  FINDINGS: Two intraoperative fluoroscopic images demonstrate the patient to be status post left total hip arthroplasty.  The femoral and acetabular components appear to be well situated.  IMPRESSION: Status post left total hip arthroplasty.   Electronically Signed   By: Roque LiasJames  Green M.D.   On: 08/05/2013 11:28   Dg Ankle Complete Left  08/07/2013   CLINICAL DATA:  post op pain  EXAM: LEFT ANKLE COMPLETE - 3+ VIEW  COMPARISON:  None.  FINDINGS: There is no evidence of fracture, dislocation, or joint effusion. There is no evidence of arthropathy or other focal bone abnormality. Soft tissues are unremarkable.  IMPRESSION: Negative.   Electronically Signed   By: Salome HolmesHector  Cooper M.D.   On: 08/07/2013 17:42   Dg Pelvis Portable  08/05/2013   CLINICAL DATA:  Postoperative arthroplasty  EXAM: PORTABLE PELVIS 1-2 VIEWS  COMPARISON:  MR pelvis and hips July 04, 2013  FINDINGS: There is a total hip prosthesis on the left which appears well-seated on this single view. There is extensive soft tissue air on the left. No acute fracture or dislocation. There is mild narrowing of the right hip joint.  IMPRESSION: Arthroplasty on the left appears well seated. Extensive soft tissue air on the left, a presumed postoperative finding.   Electronically Signed   By: Bretta BangWilliam  Woodruff M.D.   On: 08/05/2013 12:09   Dg Hip Portable 1 View Left  08/05/2013   CLINICAL DATA:  Postop left total hip replacement  EXAM: PORTABLE LEFT HIP - 1 VIEW  COMPARISON:  AP pelvis radiograph- earlier same day  FINDINGS: This examination is interpreted in conjunction with AP pelvis radiograph performed earlier same date.  Post left total hip replacement. Alignment appears anatomic. No evidence of hardware failure or loosening. No fracture. There is a minimal amount of expected subcutaneous emphysema about the operative site. Several phleboliths overlie the lower pelvis bilaterally. No radiopaque foreign body.  IMPRESSION: Post left total hip replacement without evidence of complication.   Electronically Signed   By: Simonne ComeJohn  Watts M.D.   On: 08/05/2013 12:12   Dg  Tibia/fibula Left Port  08/05/2013   CLINICAL DATA:  Postop calf/lower leg pain.  EXAM: PORTABLE LEFT TIBIA AND FIBULA - 2 VIEW  COMPARISON:  None.  FINDINGS: No fracture of the tibia or fibula is identified. Knee and ankle are located. No lytic or blastic osseous lesion is seen. Diffuse vascular calcification is noted in the leg. No radiopaque foreign body is identified.  IMPRESSION: No acute osseous abnormality identified.   Electronically Signed   By:  Sebastian AcheAllen  Grady   On: 08/05/2013 12:13   Dg Foot Complete Left  08/07/2013   CLINICAL DATA:  post op pain  EXAM: LEFT FOOT - COMPLETE 3+ VIEW  COMPARISON:  None.  FINDINGS: There is no evidence of fracture or dislocation. There is no evidence of arthropathy or other focal bone abnormality. Soft tissues are unremarkable.  IMPRESSION: Negative.   Electronically Signed   By: Salome HolmesHector  Cooper M.D.   On: 08/07/2013 17:43   Dg C-arm 1-60 Min-no Report  08/05/2013   CLINICAL DATA:  Left hip arthroplasty.  EXAM: DG C-ARM 1-60 MIN - NRPT [facility]; LEFT HIP - COMPLETE 2+ VIEW  COMPARISON:  None.  FINDINGS: Two intraoperative fluoroscopic images demonstrate the patient to be status post left total hip arthroplasty. The femoral and acetabular components appear to be well situated.  IMPRESSION: Status post left total hip arthroplasty.   Electronically Signed   By: Roque LiasJames  Green M.D.   On: 08/05/2013 11:28    Disposition: Final discharge disposition not confirmed      Discharge Instructions   Discharge patient    Complete by:  As directed            Follow-up Information   Follow up with Kathryne HitchBLACKMAN,Terryon Pineiro Y, MD In 2 weeks.   Specialty:  Orthopedic Surgery   Contact information:   [address] [place] [id number] [phone]        Signed: Kathryne HitchChristopher Y Aleksia Freiman 08/08/2013, 7:14 AM

## 2013-08-08 NOTE — Progress Notes (Signed)
[facility] the [facility]: RW and [facility]mode  If patient discharges after hours, please call [phone].   Jesus Wagner 08/08/2013, 9:07 AM

## 2013-08-08 NOTE — Progress Notes (Signed)
Physical Therapy Treatment Patient Details Name: Jesus Wagner MRN: [id number] DOB: September 24, 1935 Today's Date: 08/08/2013    History of Present Illness L THR    PT Comments    POD # 3 pt ready for D/C to home.  Pt declined to perform stair training as he did this yesterday with spouse and did not feel need to do again today.  Pt given handout on THR HEP and performed all standing TE's.  Spouse present and observed.  Demonstrated proper tech to get in/out tub shower using "back kick" approach. Instructed on use of ICE.  Instructed on proper tech to get in/out car.  Instructed proper positioning side lying.    Follow Up Recommendations  Home health PT     Equipment Recommendations       Recommendations for Other Services       Precautions / Restrictions Precautions Precautions: Fall Restrictions Weight Bearing Restrictions: No Other Position/Activity Restrictions: WBAT    Mobility  Bed Mobility Overal bed mobility: Needs Assistance Bed Mobility: Supine to Sit     Supine to sit: Supervision     General bed mobility comments: Pt OOB in recliner  Transfers Overall transfer level: Modified independent Equipment used: Rolling walker (2 wheeled) Transfers: Sit to/from Stand Sit to Stand: Supervision;Min guard         General transfer comment: increased time  Ambulation/Gait             General Gait Details: declined amb to perform TE's   Stairs         General stair comments: declined stair training as he performed it yesterday with spouse and felt good about it with no need to do again today.  Wheelchair Mobility    Modified Rankin (Stroke Patients Only)       Balance                                    Cognition Arousal/Alertness: Awake/alert Behavior During Therapy: WFL for tasks assessed/performed Overall Cognitive Status: Within Functional Limits for tasks assessed                      Exercises  10 reps standing L  hip flex, hip ABD, hip extension and knee flexion following handout.     General Comments        Pertinent Vitals/Pain C/o "soreness' ICE applied    Home Living                      Prior Function            PT Goals (current goals can now be found in the care plan section) Acute Rehab PT Goals Patient Stated Goal: Walk without pain Progress towards PT goals: Progressing toward goals    Frequency       PT Plan      Co-evaluation             End of Session   Activity Tolerance: Patient tolerated treatment well Patient left: in chair;with call bell/phone within reach;with family/visitor present     Time: [id number] PT Time Calculation (min): 25 min  Charges:  $Therapeutic Exercise: 8-22 mins $Self Care/Home Management: 8-22                    G Codes:      Felecia ShellingLori Atilla Zollner  PTA [facility]  Acute  Rehab Pager  [phone] 

## 2013-08-08 NOTE — Progress Notes (Signed)
RN reviewed discharge instructions with patient. Patient stated understanding of pain management, blood thinner medication, Xarelto, proper positioning, equipment, home health physical therapy, and follow up appointment with MD.   Marybelle KillingsPaperwork and prescriptions given to patient. All questions answered.  NT rolled patient down in wheelchair.

## 2013-08-08 NOTE — Progress Notes (Signed)
Subjective: 3 Days Post-Op Procedure(s) (LRB): LEFT TOTAL HIP ARTHROPLASTY ANTERIOR APPROACH (Left) Patient reports pain as moderate.    Objective: Vital signs in last 24 hours: Temp:  [98.4 F (36.9 C)-98.7 F (37.1 C)] 98.4 F (36.9 C) (05/18 0556) Pulse Rate:  [73-82] 81 (05/18 0556) Resp:  [18-19] 18 (05/18 0556) BP: (124-149)/(62-74) 129/74 mmHg (05/18 0556) SpO2:  [99 %-100 %] 100 % (05/18 0556)  Intake/Output from previous day: 05/17 0701 - 05/18 0700 In: 970 [P.O.:970] Out: 2225 [Urine:2225] Intake/Output this shift:     Recent Labs  08/06/13 0530 08/07/13 0530 08/08/13 0439  HGB 12.0* 11.1* 10.0*    Recent Labs  08/07/13 0530 08/08/13 0439  WBC 5.8 5.1  RBC 3.47* 3.18*  HCT 30.6* 27.9*  PLT 123* 116*    Recent Labs  08/06/13 0530  NA 136*  K 5.0  CL 101  CO2 28  BUN 14  CREATININE 0.86  GLUCOSE 97  CALCIUM 8.8   No results found for this basename: LABPT, INR,  in the last 72 hours  Sensation intact distally Intact pulses distally Dorsiflexion/Plantar flexion intact Incision: dressing C/D/I Compartment soft  Left foot and ankle pain, but normal exam    Assessment/Plan: 3 Days Post-Op Procedure(s) (LRB): LEFT TOTAL HIP ARTHROPLASTY ANTERIOR APPROACH (Left) Discharge home with home health  Jesus Wagner 08/08/2013, 7:14 AM

## 2013-08-08 NOTE — Progress Notes (Signed)
Occupational Therapy Treatment Patient Details Name: Juluis PitchRobert Schnepp MRN: [id number] DOB: 1935-12-25 Today's Date: 08/08/2013    History of present illness  THA      Follow Up Recommendations  Home health OT;Supervision/Assistance - 24 hour    Equipment Recommendations  Tub/shower seat    Recommendations for Other Services      Precautions / Restrictions Precautions Precautions: Fall Restrictions Weight Bearing Restrictions: No Other Position/Activity Restrictions: WBAT       Mobility Bed Mobility Overal bed mobility: Needs Assistance Bed Mobility: Supine to Sit     Supine to sit: Supervision     General bed mobility comments: cues for sequencing and use of R LE to self assist  Transfers Overall transfer level: Needs assistance Equipment used: Rolling walker (2 wheeled) Transfers: Sit to/from Stand Sit to Stand: Min guard;Supervision         General transfer comment: cues for hand placement    Balance                                   ADL Overall ADL's : Needs assistance/impaired     Grooming: Wash/dry hands;Wash/dry face;Standing;Supervision/safety               Lower Body Dressing: Minimal assistance;Sit to/from stand   Toilet Transfer: Theme park managerMin guard;RW;Regular Toilet   Toileting- ArchitectClothing Manipulation and Hygiene: Min guard;Sit to/from stand       Functional mobility during ADLs: Supervision/safety;Rolling walker         Cognition   Behavior During Therapy: WFL for tasks assessed/performed Overall Cognitive Status: Within Functional Limits for tasks assessed                                 Progress Toward Goals  OT Goals(current goals can now be found in the care plan section)  Progress towards OT goals: Progressing toward goals  Acute Rehab OT Goals Patient Stated Goal: Walk without pain  Plan Discharge plan remains appropriate       End of Session     Activity Tolerance Patient tolerated treatment  well   Patient Left in chair;with call bell/phone within reach   Nurse Communication Mobility status        Time: 1000-1024 OT Time Calculation (min): 24 min  Charges: OT General Charges $OT Visit: 1 Procedure OT Treatments $Self Care/Home Management : 23-37 mins  Alba CoryLorraine D Eryck Negron 08/08/2013, 10:51 AM

## 2015-04-14 IMAGING — CR DG HIP 1V PORT*L*
1 series · 1 of 1 positions shown · non-contrast
Comparison: AP pelvis radiograph- earlier same day

CLINICAL DATA: Postop left total hip replacement

EXAM:
PORTABLE LEFT HIP - 1 VIEW

[AP]
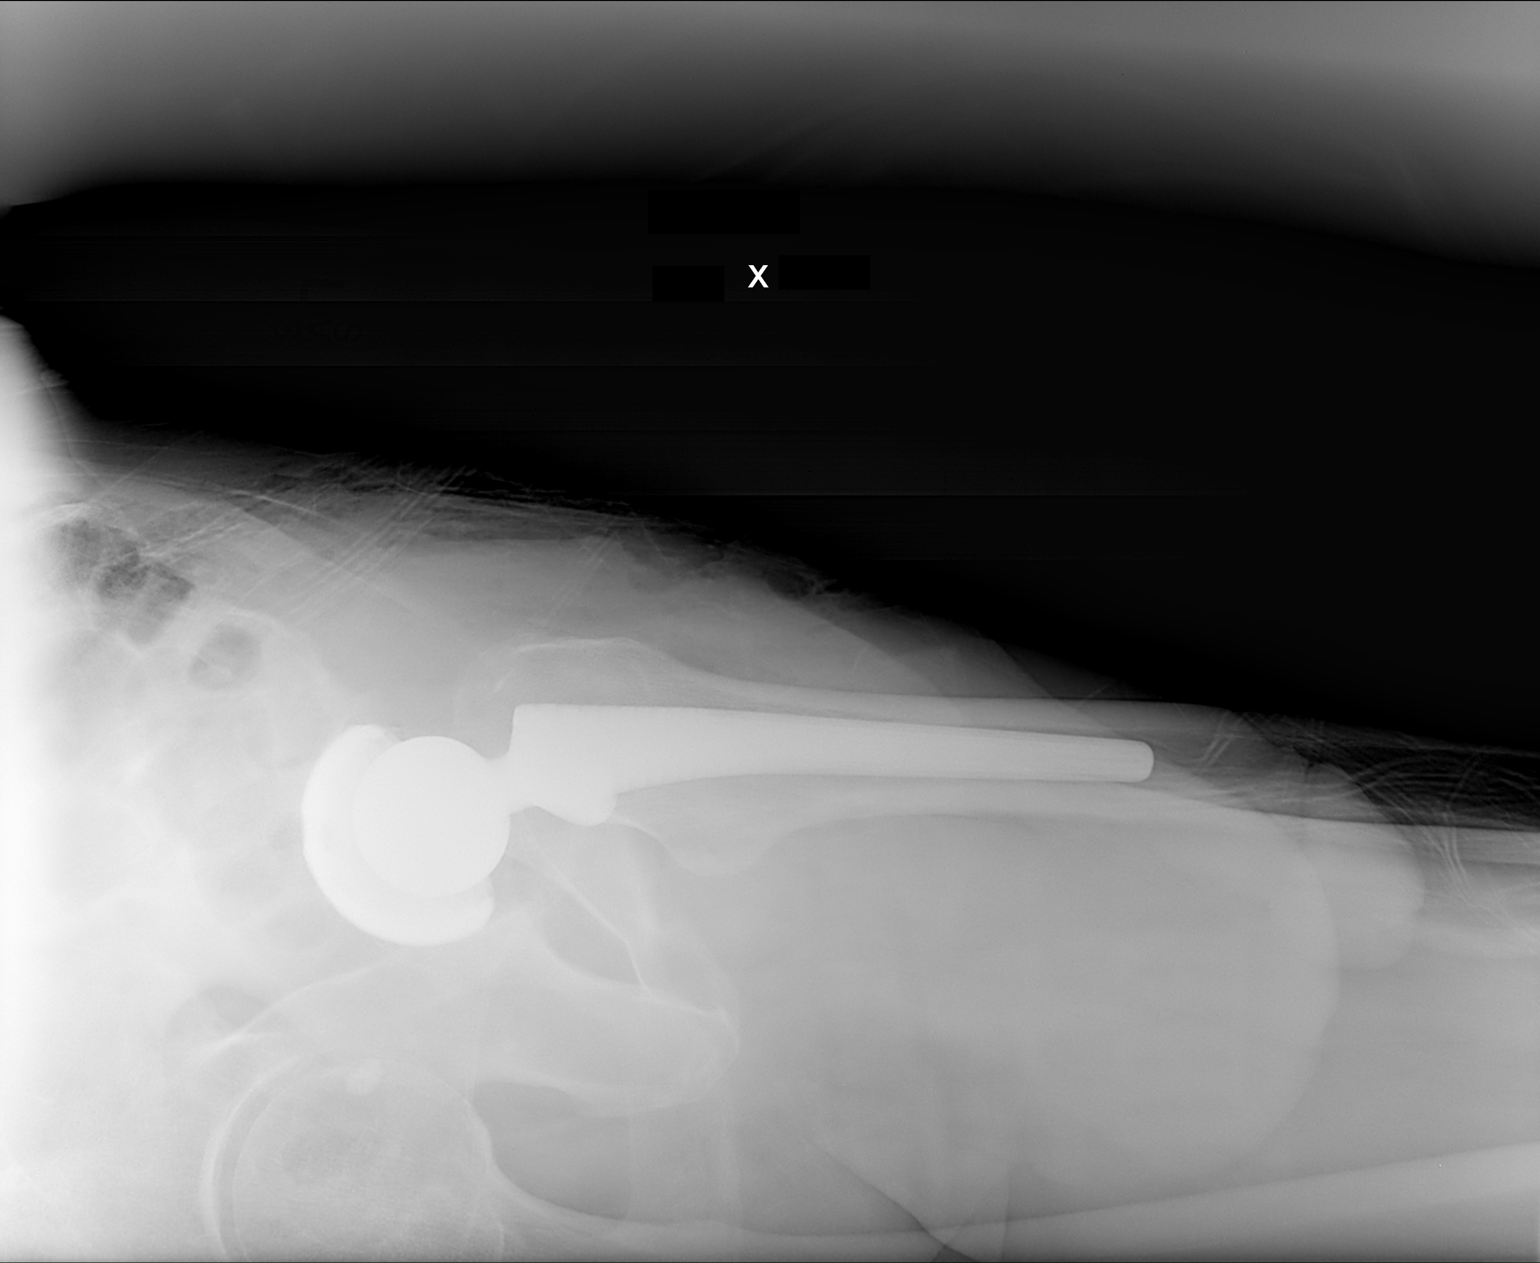

[1 of 1 positions shown; findings below may reference images not displayed]

FINDINGS: This examination is interpreted in conjunction with AP pelvis
radiograph performed earlier same date.

Post left total hip replacement. Alignment appears anatomic. No
evidence of hardware failure or loosening. No fracture. There is a
minimal amount of expected subcutaneous emphysema about the
operative site. Several phleboliths overlie the lower pelvis
bilaterally. No radiopaque foreign body.
IMPRESSION: Post left total hip replacement without evidence of complication.

## 2015-04-14 IMAGING — CR DG PORTABLE PELVIS
1 series · 1 of 1 positions shown · non-contrast
Comparison: MR pelvis and hips July 04, 2013

CLINICAL DATA: Postoperative arthroplasty

EXAM:
PORTABLE PELVIS 1-2 VIEWS

[AP]
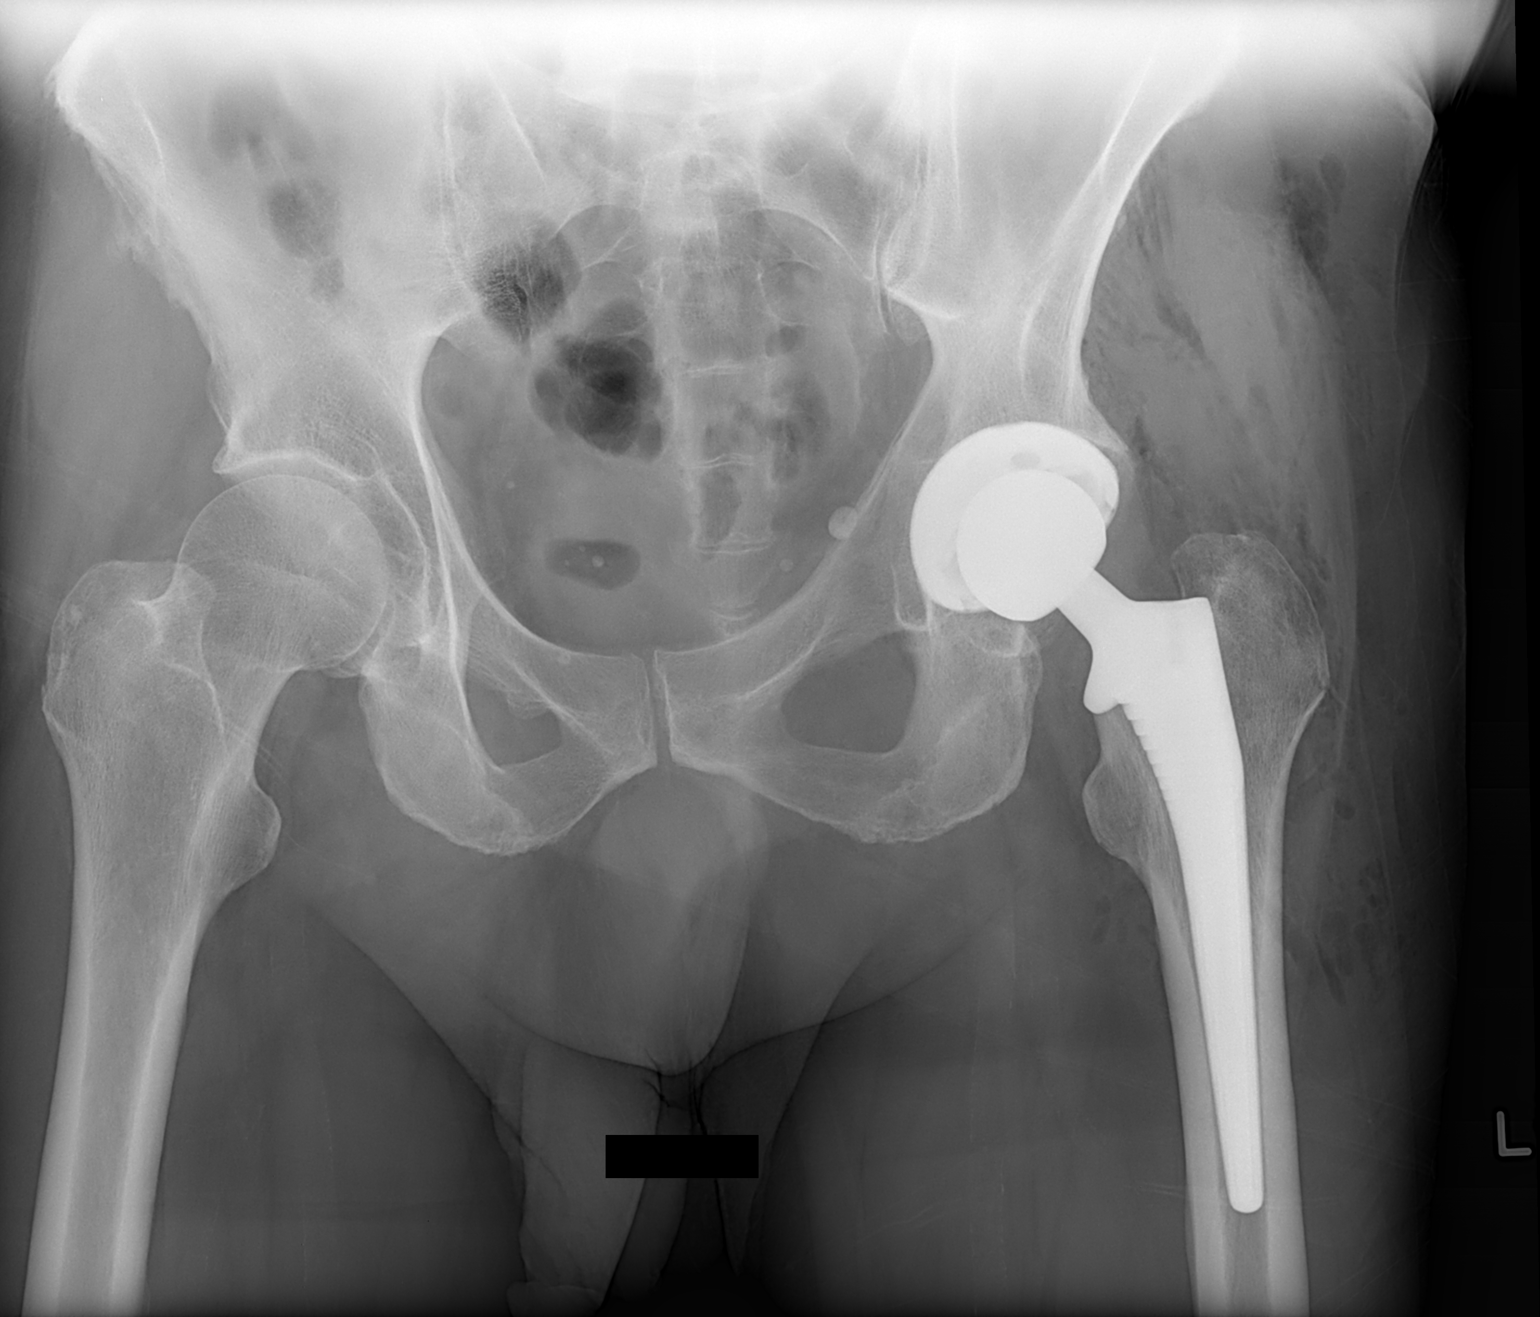

[1 of 1 positions shown; findings below may reference images not displayed]

FINDINGS: There is a total hip prosthesis on the left which appears
well-seated on this single view. There is extensive soft tissue air
on the left. No acute fracture or dislocation. There is mild
narrowing of the right hip joint.
IMPRESSION: Arthroplasty on the left appears well seated. Extensive soft tissue
air on the left, a presumed postoperative finding.

## 2015-04-14 IMAGING — RF DG HIP (WITH OR WITHOUT PELVIS) 2-3V*L*
1 series · 2 of 2 positions shown · non-contrast
Comparison: None.

CLINICAL DATA: Left hip arthroplasty.

EXAM:
DG C-ARM 1-60 MIN - NRPT [facility]; LEFT HIP - COMPLETE 2+ VIEW

[Series 1: run · 2 of 2 slices shown]
[im 1/2]
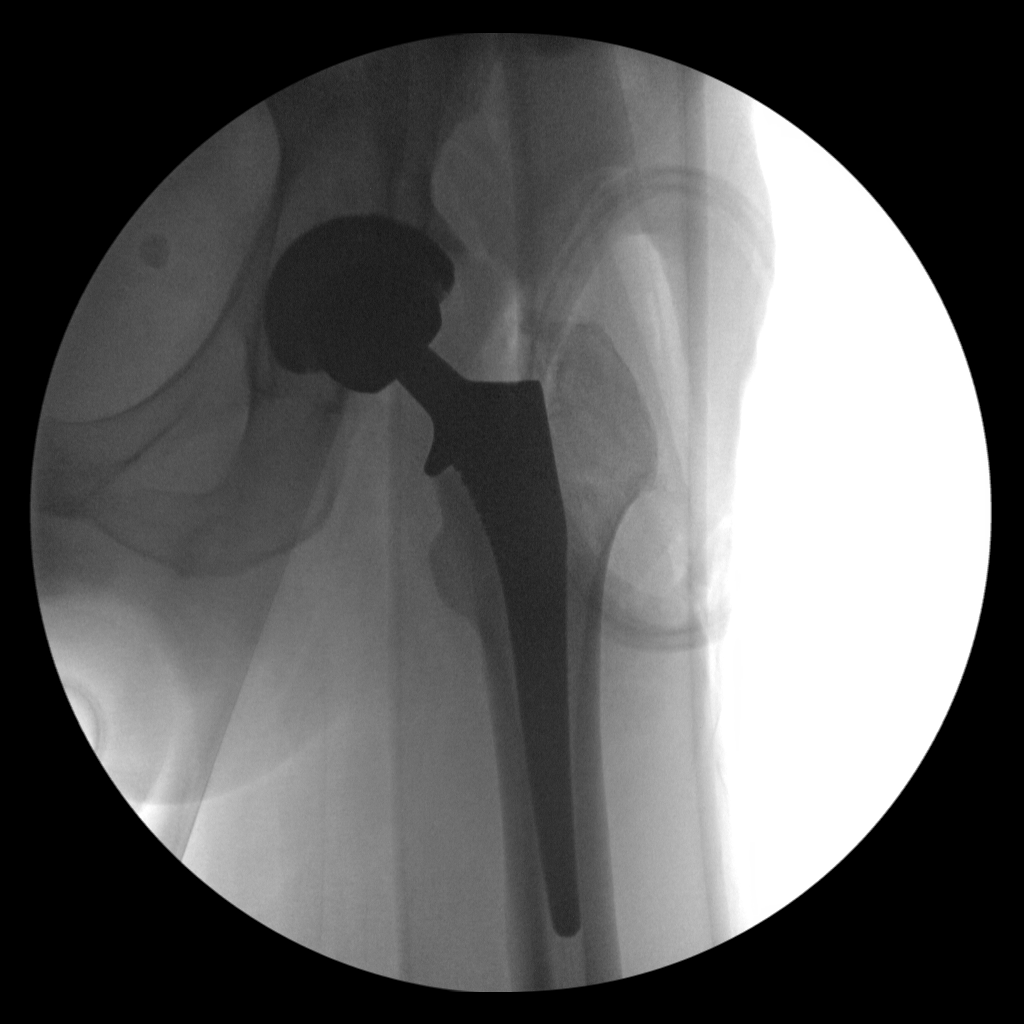
[im 2/2]
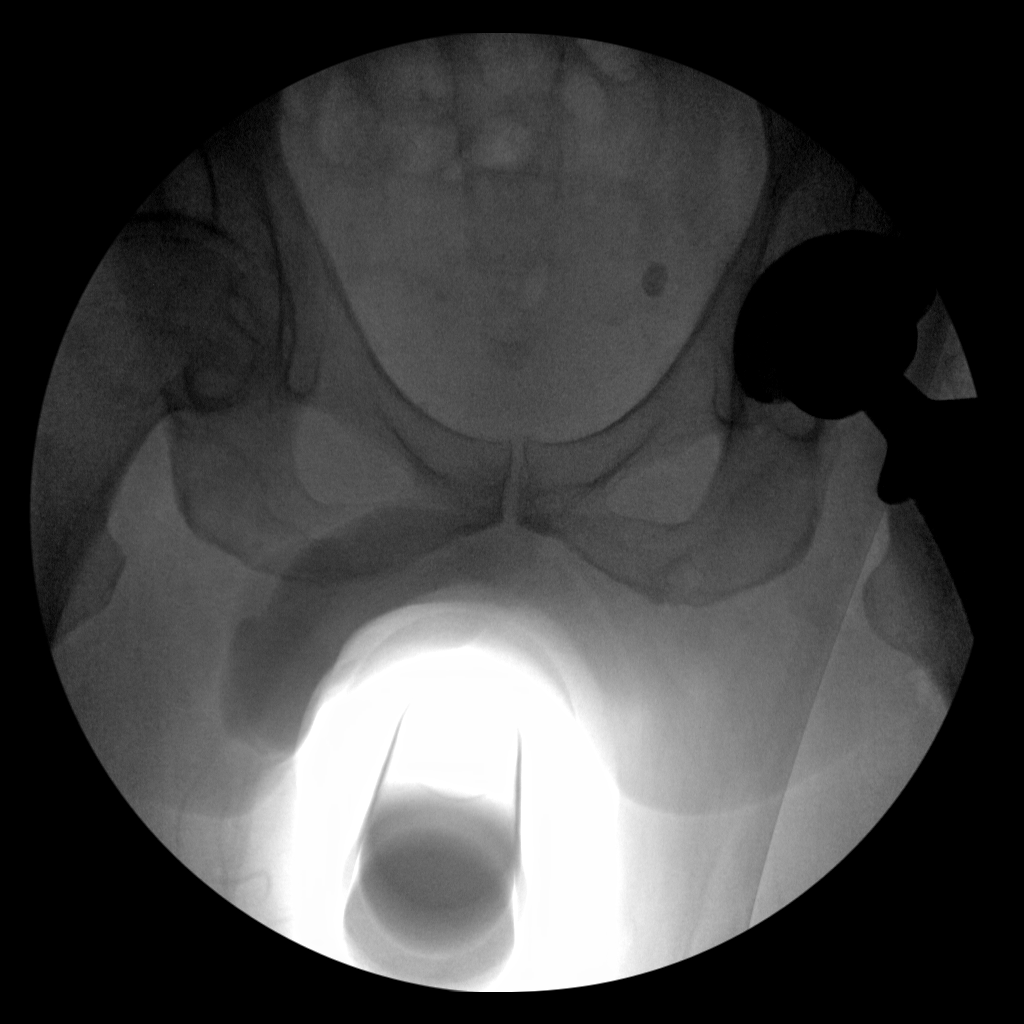

[2 of 2 positions shown; findings below may reference images not displayed]

FINDINGS: Two intraoperative fluoroscopic images demonstrate the patient to be
status post left total hip arthroplasty. The femoral and acetabular
components appear to be well situated.
IMPRESSION: Status post left total hip arthroplasty.

## 2015-04-16 IMAGING — CR DG ANKLE COMPLETE 3+V*L*
1 series · 3 of 3 positions shown · non-contrast
Comparison: None.

CLINICAL DATA: post op pain

EXAM:
LEFT ANKLE COMPLETE - 3+ VIEW

[Series 1: AP · left · 3 of 3 slices shown]
[im 1/3]
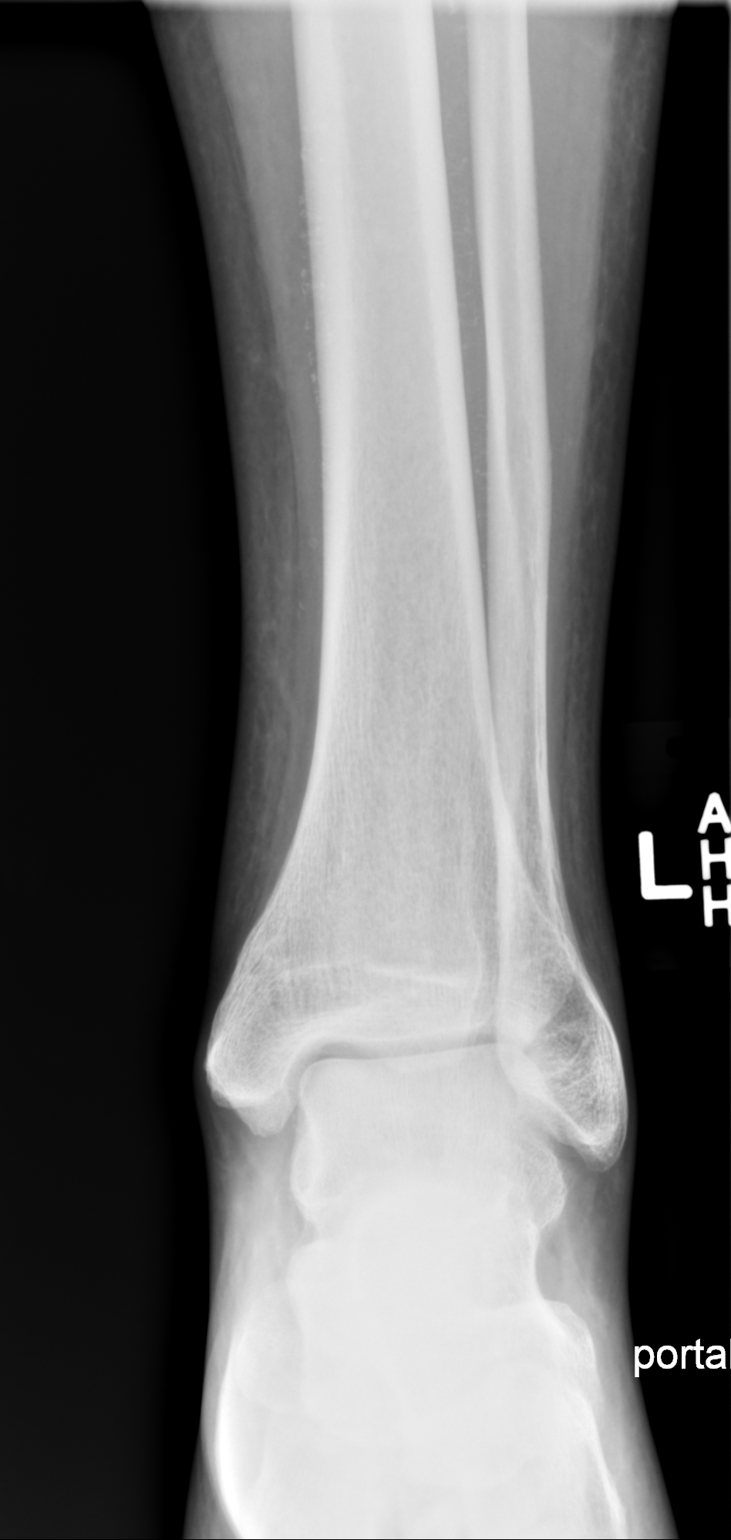
[im 2/3]
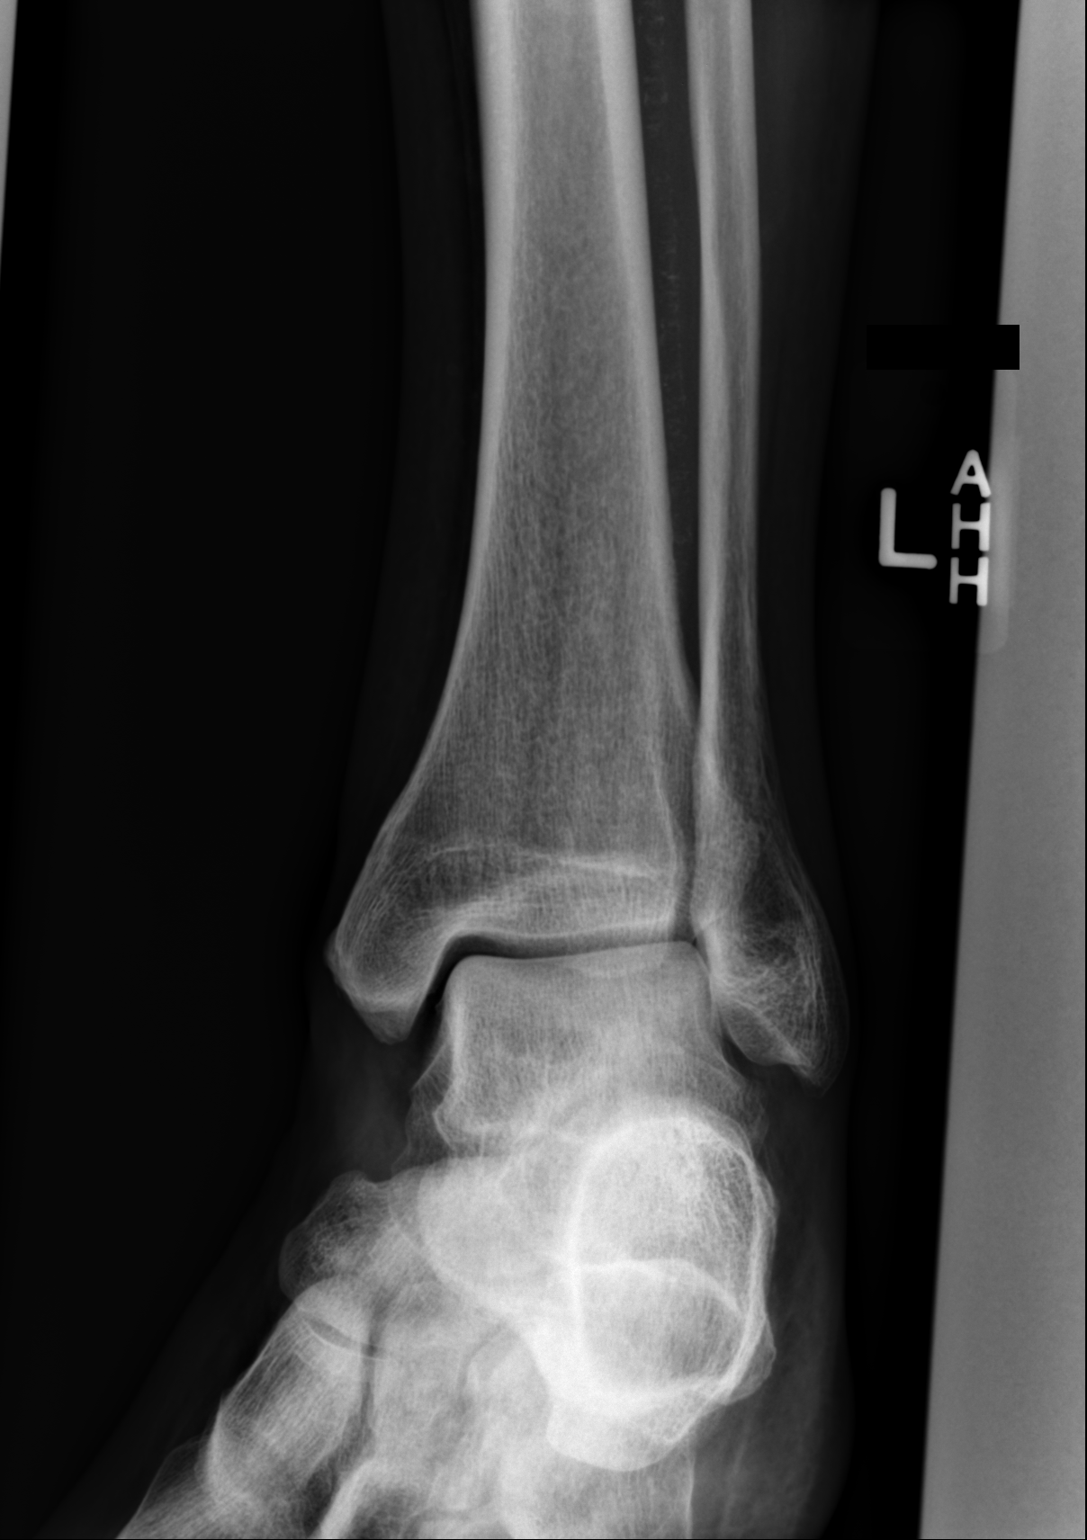
[im 3/3]
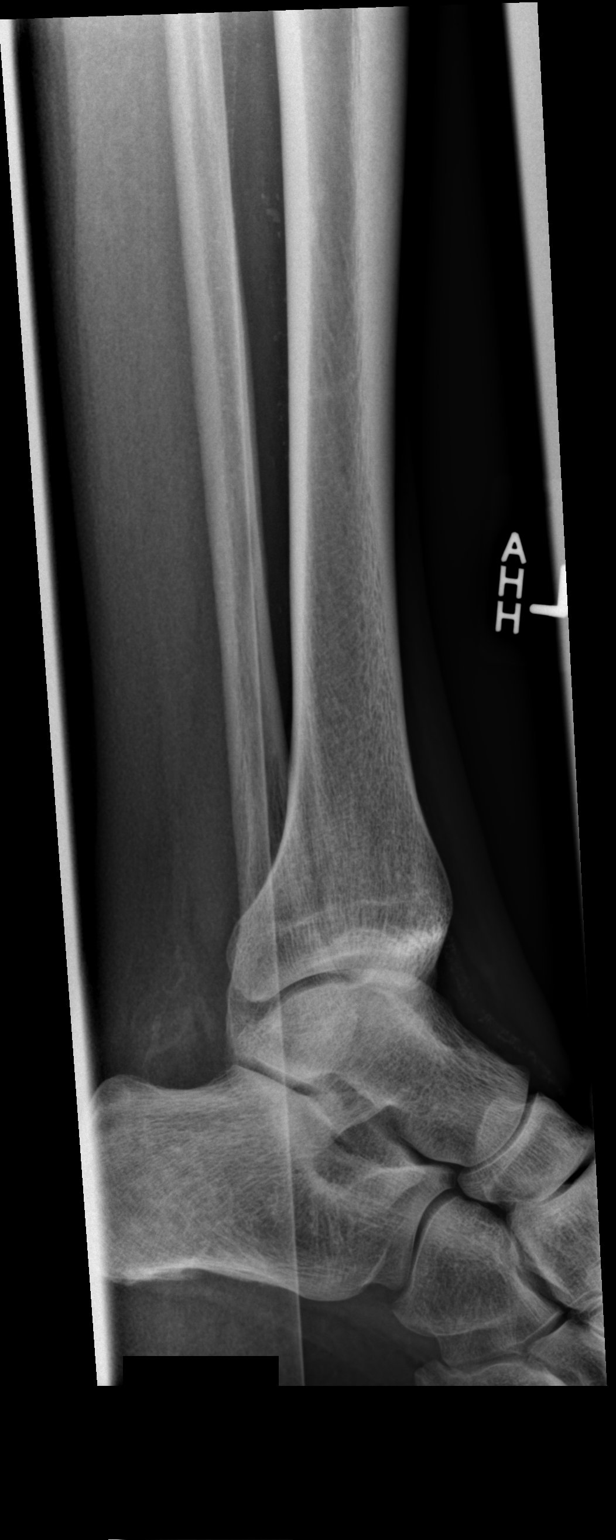

[3 of 3 positions shown; findings below may reference images not displayed]

FINDINGS: There is no evidence of fracture, dislocation, or joint effusion.
There is no evidence of arthropathy or other focal bone abnormality.
Soft tissues are unremarkable.
IMPRESSION: Negative.

## 2017-08-03 ENCOUNTER — Ambulatory Visit: Payer: Medicare Other

## 2018-03-29 ENCOUNTER — Other Ambulatory Visit: Payer: Self-pay

## 2018-03-30 ENCOUNTER — Ambulatory Visit: Payer: Medicare Other | Admitting: Sports Medicine

## 2018-03-30 ENCOUNTER — Encounter: Payer: Self-pay | Admitting: Sports Medicine

## 2018-03-30 VITALS — BP 156/74 | HR 79

## 2018-03-30 DIAGNOSIS — L600 Ingrowing nail: Secondary | ICD-10-CM | POA: Diagnosis not present

## 2018-03-30 DIAGNOSIS — M79676 Pain in unspecified toe(s): Secondary | ICD-10-CM | POA: Diagnosis not present

## 2018-03-30 DIAGNOSIS — B351 Tinea unguium: Secondary | ICD-10-CM | POA: Diagnosis not present

## 2018-03-30 DIAGNOSIS — M79609 Pain in unspecified limb: Principal | ICD-10-CM

## 2018-03-30 DIAGNOSIS — I739 Peripheral vascular disease, unspecified: Secondary | ICD-10-CM

## 2018-03-30 MED ORDER — NEOMYCIN-POLYMYXIN-HC 3.5-10000-1 OT SOLN: OTIC | 0 refills | Status: DC

## 2018-03-30 NOTE — Progress Notes (Signed)
Subjective: Jesus Wagner is a 83 y.o. male patient seen today in office with complaint of mildly painful big toenails due to ingrown's and right fourth toenail that is thickened, unable to trim.  Reports that he usually goes for pedicures 3-4 times per year and it takes care of it however he wanted to have his feet checked.  Patient denies history of Diabetes, Neuropathy but does state that he was on gabapentin in the past from having a hip procedure.  Admits to a history of varicose veins but no issues with warmth redness swelling in the legs besides the appearance of the vein sometimes dilating and looking broken.  Patient has no other pedal complaints at this time.   Review of Systems  All other systems reviewed and are negative.    Patient Active Problem List   Diagnosis Date Noted  . Arthritis of left hip 08/05/2013  . Status post THR (total hip replacement) 08/05/2013  . Varicose veins of lower extremities with other complications 03/31/2011    Current Outpatient Medications on File Prior to Visit  Medication Sig Dispense Refill  . gabapentin (NEURONTIN) 300 MG capsule     . cholecalciferol (VITAMIN D) 1000 UNITS tablet Take 1,000 Units by mouth daily.    Marland Kitchen lisinopril (PRINIVIL,ZESTRIL) 40 MG tablet Take 40 mg by mouth daily at 12 noon.     Marland Kitchen losartan (COZAAR) 100 MG tablet     . ondansetron (ZOFRAN ODT) 4 MG disintegrating tablet Take 1 tablet (4 mg total) by mouth 4 (four) times daily as needed for nausea or vomiting. (Patient not taking: Reported on 03/30/2018) 20 tablet 0  . oxyCODONE-acetaminophen (ROXICET) 5-325 MG per tablet Take 1-2 tablets by mouth every 4 (four) hours as needed for severe pain. (Patient not taking: Reported on 03/30/2018) 60 tablet 0  . rivaroxaban (XARELTO) 10 MG TABS tablet Take 1 tablet (10 mg total) by mouth daily with breakfast. (Patient not taking: Reported on 03/30/2018) 20 tablet 0  . rosuvastatin (CRESTOR) 10 MG tablet     . tamsulosin (FLOMAX) 0.4 MG  CAPS capsule Take 0.4 mg by mouth daily. 7 days before procedure and 7 days after procedure.     No current facility-administered medications on file prior to visit.     Allergies  Allergen Reactions  . Aspirin     Stomach upset   . Codeine Nausea Only    Objective: Physical Exam  General: Well developed, nourished, no acute distress, awake, alert and oriented x 3  Vascular: Dorsalis pedis artery 1/4 bilateral, Posterior tibial artery 1/4 bilateral, skin temperature warm to warm proximal to distal bilateral lower extremities, mild varicosities, no pedal hair present bilateral.  Neurological: Gross sensation present via light touch bilateral.   Dermatological: Skin is warm, dry, and supple bilateral, bilateral hallux and right fourth toenails are tender, long, thick, and discolored with mild subungal debris, there is incurvation of the nail but no acute signs of infection to the nails that would signify a acute problem with his ingrown curvature at bilateral hallux, no webspace macerations present bilateral, no open lesions present bilateral, no callus/corns/hyperkeratotic tissue present bilateral. No signs of infection bilateral.  Musculoskeletal: Asymptomatic hammertoe/overlapping boney deformities noted bilateral. Muscular strength within normal limits without painon range of motion. No pain with calf compression bilateral.  Assessment and Plan:  Problem List Items Addressed This Visit    None    Visit Diagnoses    Pain due to onychomycosis of nail    -  Primary   Ingrown nail       PVD (peripheral vascular disease) (HCC)          -Examined patient.  -Discussed treatment options for painful mycotic nails. -Mechanically debrided and reduced painful nails x3 with sterile nail nipper and dremel nail file without incident. -Patient to return in 3 months for follow up evaluation or sooner if symptoms worsen.  Asencion Islam, DPM

## 2018-07-09 ENCOUNTER — Emergency Department (HOSPITAL_COMMUNITY): Payer: Medicare Other

## 2018-07-09 ENCOUNTER — Emergency Department (HOSPITAL_COMMUNITY): Payer: Medicare Other | Admitting: Anesthesiology

## 2018-07-09 ENCOUNTER — Encounter (HOSPITAL_COMMUNITY): Payer: Self-pay | Admitting: Emergency Medicine

## 2018-07-09 ENCOUNTER — Observation Stay (HOSPITAL_COMMUNITY)
Admission: EM | Admit: 2018-07-09 | Discharge: 2018-07-10 | Disposition: A | Discharge status: Home / Self Care | Payer: Medicare Other | Attending: General Surgery | Admitting: General Surgery

## 2018-07-09 ENCOUNTER — Other Ambulatory Visit: Payer: Self-pay

## 2018-07-09 ENCOUNTER — Encounter (HOSPITAL_COMMUNITY)
Admission: EM | Disposition: A | Discharge status: Home / Self Care | Payer: Self-pay | Source: Home / Self Care | Attending: Emergency Medicine

## 2018-07-09 DIAGNOSIS — R1011 Right upper quadrant pain: Secondary | ICD-10-CM

## 2018-07-09 DIAGNOSIS — Z886 Allergy status to analgesic agent status: Secondary | ICD-10-CM | POA: Insufficient documentation

## 2018-07-09 DIAGNOSIS — Z79899 Other long term (current) drug therapy: Secondary | ICD-10-CM | POA: Diagnosis not present

## 2018-07-09 DIAGNOSIS — K8010 Calculus of gallbladder with chronic cholecystitis without obstruction: Secondary | ICD-10-CM | POA: Diagnosis not present

## 2018-07-09 DIAGNOSIS — Z96642 Presence of left artificial hip joint: Secondary | ICD-10-CM | POA: Insufficient documentation

## 2018-07-09 DIAGNOSIS — I10 Essential (primary) hypertension: Secondary | ICD-10-CM | POA: Diagnosis not present

## 2018-07-09 DIAGNOSIS — R52 Pain, unspecified: Secondary | ICD-10-CM

## 2018-07-09 DIAGNOSIS — K811 Chronic cholecystitis: Secondary | ICD-10-CM

## 2018-07-09 DIAGNOSIS — Z9049 Acquired absence of other specified parts of digestive tract: Secondary | ICD-10-CM

## 2018-07-09 DIAGNOSIS — N400 Enlarged prostate without lower urinary tract symptoms: Secondary | ICD-10-CM | POA: Diagnosis not present

## 2018-07-09 HISTORY — PX: CHOLECYSTECTOMY: SHX55

## 2018-07-09 LAB — CBC WITH DIFFERENTIAL/PLATELET
Abs Immature Granulocytes: 0.05 K/uL (ref 0.00–0.07)
Basophils Absolute: 0 K/uL (ref 0.0–0.1)
Basophils Relative: 0 %
Eosinophils Absolute: 0 K/uL (ref 0.0–0.5)
Eosinophils Relative: 0 %
HCT: 40.9 % (ref 39.0–52.0)
Hemoglobin: 14.5 g/dL (ref 13.0–17.0)
Immature Granulocytes: 1 %
Lymphocytes Relative: 11 %
Lymphs Abs: 0.8 K/uL (ref 0.7–4.0)
MCH: 32.8 pg (ref 26.0–34.0)
MCHC: 35.5 g/dL (ref 30.0–36.0)
MCV: 92.5 fL (ref 80.0–100.0)
Monocytes Absolute: 0.2 K/uL (ref 0.1–1.0)
Monocytes Relative: 3 %
Neutro Abs: 6.7 K/uL (ref 1.7–7.7)
Neutrophils Relative %: 85 %
Platelets: 151 K/uL (ref 150–400)
RBC: 4.42 MIL/uL (ref 4.22–5.81)
RDW: 12.2 % (ref 11.5–15.5)
WBC: 7.8 K/uL (ref 4.0–10.5)
nRBC: 0 % (ref 0.0–0.2)

## 2018-07-09 LAB — URINALYSIS, ROUTINE W REFLEX MICROSCOPIC
Bilirubin Urine: NEGATIVE
Glucose, UA: NEGATIVE mg/dL
Hgb urine dipstick: NEGATIVE
Ketones, ur: 5 mg/dL — AB
Leukocytes,Ua: NEGATIVE
Nitrite: NEGATIVE
Protein, ur: NEGATIVE mg/dL
Specific Gravity, Urine: 1.019 (ref 1.005–1.030)
pH: 8 (ref 5.0–8.0)

## 2018-07-09 LAB — COMPREHENSIVE METABOLIC PANEL WITH GFR
ALT: 20 U/L (ref 0–44)
AST: 22 U/L (ref 15–41)
Albumin: 4.6 g/dL (ref 3.5–5.0)
Alkaline Phosphatase: 84 U/L (ref 38–126)
Anion gap: 9 (ref 5–15)
BUN: 14 mg/dL (ref 8–23)
CO2: 23 mmol/L (ref 22–32)
Calcium: 9.3 mg/dL (ref 8.9–10.3)
Chloride: 105 mmol/L (ref 98–111)
Creatinine, Ser: 0.73 mg/dL (ref 0.61–1.24)
GFR calc Af Amer: >60 mL/min
GFR calc non Af Amer: >60 mL/min
Glucose, Bld: 138 mg/dL — ABNORMAL HIGH (ref 70–99)
Potassium: 3.7 mmol/L (ref 3.5–5.1)
Sodium: 137 mmol/L (ref 135–145)
Total Bilirubin: 1.4 mg/dL — ABNORMAL HIGH (ref 0.3–1.2)
Total Protein: 7.5 g/dL (ref 6.5–8.1)

## 2018-07-09 LAB — I-STAT TROPONIN, ED: Troponin i, poc: 0 ng/mL (ref 0.00–0.08)

## 2018-07-09 LAB — LIPASE, BLOOD: Lipase: 22 U/L (ref 11–51)

## 2018-07-09 SURGERY — LAPAROSCOPIC CHOLECYSTECTOMY WITH INTRAOPERATIVE CHOLANGIOGRAM
Anesthesia: General | Site: Abdomen

## 2018-07-09 MED ORDER — PROPOFOL 10 MG/ML IV BOLUS
INTRAVENOUS | Status: DC | PRN
  Administered 2018-07-09: 120 mg via INTRAVENOUS

## 2018-07-09 MED ORDER — ONDANSETRON HCL 4 MG/2ML IJ SOLN
INTRAMUSCULAR | Status: DC | PRN
  Administered 2018-07-09: 4 mg via INTRAVENOUS

## 2018-07-09 MED ORDER — MORPHINE SULFATE (PF) 2 MG/ML IV SOLN: 1.0000 mg | INTRAVENOUS | Status: DC | PRN

## 2018-07-09 MED ORDER — MORPHINE SULFATE (PF) 4 MG/ML IV SOLN
4.0000 mg | Freq: Once | INTRAVENOUS | Status: AC
  Administered 2018-07-09: 4 mg via INTRAVENOUS
  Filled 2018-07-09: qty 1

## 2018-07-09 MED ORDER — SODIUM CHLORIDE 0.9 % IV SOLN
INTRAVENOUS | Status: DC
  Administered 2018-07-09: 14:00:00 via INTRAVENOUS

## 2018-07-09 MED ORDER — LACTATED RINGERS IV SOLN
INTRAVENOUS | Status: AC | PRN
  Administered 2018-07-09: 1000 mL via INTRAVENOUS

## 2018-07-09 MED ORDER — OXYCODONE HCL 5 MG PO TABS: 5.0000 mg | ORAL_TABLET | ORAL | Status: DC | PRN

## 2018-07-09 MED ORDER — DICYCLOMINE HCL 10 MG/ML IM SOLN
10.0000 mg | Freq: Once | INTRAMUSCULAR | Status: AC
  Administered 2018-07-09: 10 mg via INTRAMUSCULAR
  Filled 2018-07-09: qty 2

## 2018-07-09 MED ORDER — LACTATED RINGERS IV SOLN
INTRAVENOUS | Status: DC
  Administered 2018-07-09: 10:00:00 via INTRAVENOUS

## 2018-07-09 MED ORDER — ONDANSETRON HCL 4 MG/2ML IJ SOLN
4.0000 mg | Freq: Once | INTRAMUSCULAR | Status: AC
  Administered 2018-07-09: 4 mg via INTRAVENOUS
  Filled 2018-07-09: qty 2

## 2018-07-09 MED ORDER — OXYCODONE HCL 5 MG PO TABS: 5.0000 mg | ORAL_TABLET | Freq: Once | ORAL | Status: DC | PRN

## 2018-07-09 MED ORDER — PANTOPRAZOLE SODIUM 40 MG IV SOLR
40.0000 mg | Freq: Once | INTRAVENOUS | Status: AC
  Administered 2018-07-09: 06:00:00 40 mg via INTRAVENOUS
  Filled 2018-07-09: qty 40

## 2018-07-09 MED ORDER — METHOCARBAMOL 500 MG PO TABS: 500.0000 mg | ORAL_TABLET | Freq: Four times a day (QID) | ORAL | Status: DC | PRN

## 2018-07-09 MED ORDER — SUCCINYLCHOLINE CHLORIDE 200 MG/10ML IV SOSY
PREFILLED_SYRINGE | INTRAVENOUS | Status: AC
  Filled 2018-07-09: qty 10

## 2018-07-09 MED ORDER — ROCURONIUM BROMIDE 10 MG/ML (PF) SYRINGE
PREFILLED_SYRINGE | INTRAVENOUS | Status: AC
  Filled 2018-07-09: qty 10

## 2018-07-09 MED ORDER — ROSUVASTATIN CALCIUM 10 MG PO TABS
10.0000 mg | ORAL_TABLET | Freq: Every day | ORAL | Status: DC
  Administered 2018-07-09 – 2018-07-10 (×2): 10 mg via ORAL
  Filled 2018-07-09 (×2): qty 1

## 2018-07-09 MED ORDER — ONDANSETRON HCL 4 MG/2ML IJ SOLN: 4.0000 mg | Freq: Four times a day (QID) | INTRAMUSCULAR | Status: DC | PRN

## 2018-07-09 MED ORDER — ROCURONIUM BROMIDE 10 MG/ML (PF) SYRINGE
PREFILLED_SYRINGE | INTRAVENOUS | Status: DC | PRN
  Administered 2018-07-09: 40 mg via INTRAVENOUS

## 2018-07-09 MED ORDER — LIDOCAINE 2% (20 MG/ML) 5 ML SYRINGE
INTRAMUSCULAR | Status: DC | PRN
  Administered 2018-07-09: 60 mg via INTRAVENOUS

## 2018-07-09 MED ORDER — ONDANSETRON 4 MG PO TBDP: 4.0000 mg | ORAL_TABLET | Freq: Four times a day (QID) | ORAL | Status: DC | PRN

## 2018-07-09 MED ORDER — FENTANYL CITRATE (PF) 100 MCG/2ML IJ SOLN
INTRAMUSCULAR | Status: AC
  Filled 2018-07-09: qty 2

## 2018-07-09 MED ORDER — PROPOFOL 10 MG/ML IV BOLUS
INTRAVENOUS | Status: AC
  Filled 2018-07-09: qty 40

## 2018-07-09 MED ORDER — IOHEXOL 300 MG/ML  SOLN
100.0000 mL | Freq: Once | INTRAMUSCULAR | Status: AC | PRN
  Administered 2018-07-09: 100 mL via INTRAVENOUS

## 2018-07-09 MED ORDER — ENOXAPARIN SODIUM 40 MG/0.4ML ~~LOC~~ SOLN
40.0000 mg | SUBCUTANEOUS | Status: DC
  Filled 2018-07-09: qty 0.4

## 2018-07-09 MED ORDER — BUPIVACAINE-EPINEPHRINE (PF) 0.25% -1:200000 IJ SOLN
INTRAMUSCULAR | Status: AC
  Filled 2018-07-09: qty 30

## 2018-07-09 MED ORDER — DEXAMETHASONE SODIUM PHOSPHATE 10 MG/ML IJ SOLN
INTRAMUSCULAR | Status: AC
  Filled 2018-07-09: qty 1

## 2018-07-09 MED ORDER — SODIUM CHLORIDE 0.9 % IV SOLN
INTRAVENOUS | Status: DC
  Administered 2018-07-09: 04:00:00 via INTRAVENOUS

## 2018-07-09 MED ORDER — FENTANYL CITRATE (PF) 100 MCG/2ML IJ SOLN: 25.0000 ug | INTRAMUSCULAR | Status: DC | PRN

## 2018-07-09 MED ORDER — HYDROMORPHONE HCL 1 MG/ML IJ SOLN
0.5000 mg | Freq: Once | INTRAMUSCULAR | Status: AC
  Administered 2018-07-09: 05:00:00 0.5 mg via INTRAVENOUS
  Filled 2018-07-09: qty 1

## 2018-07-09 MED ORDER — FENTANYL CITRATE (PF) 100 MCG/2ML IJ SOLN
50.0000 ug | Freq: Once | INTRAMUSCULAR | Status: AC
  Administered 2018-07-09: 04:00:00 50 ug via INTRAVENOUS
  Filled 2018-07-09: qty 2

## 2018-07-09 MED ORDER — ONDANSETRON HCL 4 MG/2ML IJ SOLN
INTRAMUSCULAR | Status: AC
  Filled 2018-07-09: qty 2

## 2018-07-09 MED ORDER — SUGAMMADEX SODIUM 200 MG/2ML IV SOLN
INTRAVENOUS | Status: AC
  Filled 2018-07-09: qty 2

## 2018-07-09 MED ORDER — HYDROMORPHONE HCL 1 MG/ML IJ SOLN
0.5000 mg | Freq: Once | INTRAMUSCULAR | Status: AC
  Administered 2018-07-09: 06:00:00 0.5 mg via INTRAVENOUS
  Filled 2018-07-09: qty 1

## 2018-07-09 MED ORDER — SODIUM CHLORIDE (PF) 0.9 % IJ SOLN
INTRAMUSCULAR | Status: AC
  Filled 2018-07-09: qty 50

## 2018-07-09 MED ORDER — PROPOFOL 500 MG/50ML IV EMUL
INTRAVENOUS | Status: DC | PRN
  Administered 2018-07-09: 10 ug/kg/min via INTRAVENOUS

## 2018-07-09 MED ORDER — LOSARTAN POTASSIUM 50 MG PO TABS
100.0000 mg | ORAL_TABLET | Freq: Every evening | ORAL | Status: DC
  Administered 2018-07-09: 17:00:00 100 mg via ORAL
  Filled 2018-07-09: qty 2

## 2018-07-09 MED ORDER — LIDOCAINE 2% (20 MG/ML) 5 ML SYRINGE
INTRAMUSCULAR | Status: AC
  Filled 2018-07-09: qty 5

## 2018-07-09 MED ORDER — IOPAMIDOL (ISOVUE-300) INJECTION 61%
INTRAVENOUS | Status: DC | PRN
  Administered 2018-07-09: 12:00:00 50 mL

## 2018-07-09 MED ORDER — FENTANYL CITRATE (PF) 100 MCG/2ML IJ SOLN
INTRAMUSCULAR | Status: DC | PRN
  Administered 2018-07-09 (×2): 25 ug via INTRAVENOUS
  Administered 2018-07-09: 50 ug via INTRAVENOUS

## 2018-07-09 MED ORDER — BUPIVACAINE-EPINEPHRINE 0.25% -1:200000 IJ SOLN
INTRAMUSCULAR | Status: DC | PRN
  Administered 2018-07-09: 20 mL

## 2018-07-09 MED ORDER — LISINOPRIL 20 MG PO TABS: 40.0000 mg | ORAL_TABLET | Freq: Every day | ORAL | Status: DC

## 2018-07-09 MED ORDER — METRONIDAZOLE IN NACL 5-0.79 MG/ML-% IV SOLN
500.0000 mg | Freq: Once | INTRAVENOUS | Status: AC
  Administered 2018-07-09: 09:00:00 500 mg via INTRAVENOUS
  Filled 2018-07-09: qty 100

## 2018-07-09 MED ORDER — SUGAMMADEX SODIUM 200 MG/2ML IV SOLN
INTRAVENOUS | Status: DC | PRN
  Administered 2018-07-09: 200 mg via INTRAVENOUS

## 2018-07-09 MED ORDER — ONDANSETRON HCL 4 MG/2ML IJ SOLN: 4.0000 mg | Freq: Once | INTRAMUSCULAR | Status: DC | PRN

## 2018-07-09 MED ORDER — GABAPENTIN 300 MG PO CAPS
300.0000 mg | ORAL_CAPSULE | Freq: Every day | ORAL | Status: DC
  Administered 2018-07-09: 300 mg via ORAL
  Filled 2018-07-09: qty 1

## 2018-07-09 MED ORDER — SIMETHICONE 80 MG PO CHEW: 40.0000 mg | CHEWABLE_TABLET | Freq: Four times a day (QID) | ORAL | Status: DC | PRN

## 2018-07-09 MED ORDER — SUCCINYLCHOLINE CHLORIDE 200 MG/10ML IV SOSY
PREFILLED_SYRINGE | INTRAVENOUS | Status: DC | PRN
  Administered 2018-07-09: 120 mg via INTRAVENOUS

## 2018-07-09 MED ORDER — HYDROMORPHONE HCL 1 MG/ML IJ SOLN
0.5000 mg | Freq: Once | INTRAMUSCULAR | Status: AC
  Administered 2018-07-09: 0.5 mg via INTRAVENOUS
  Filled 2018-07-09: qty 1

## 2018-07-09 MED ORDER — ACETAMINOPHEN 500 MG PO TABS
1000.0000 mg | ORAL_TABLET | Freq: Four times a day (QID) | ORAL | Status: DC
  Administered 2018-07-09 – 2018-07-10 (×3): 1000 mg via ORAL
  Filled 2018-07-09 (×5): qty 2

## 2018-07-09 MED ORDER — BUPIVACAINE HCL (PF) 0.25 % IJ SOLN
INTRAMUSCULAR | Status: AC
  Filled 2018-07-09: qty 30

## 2018-07-09 MED ORDER — CEFTRIAXONE SODIUM 1 G IJ SOLR
1.0000 g | Freq: Once | INTRAMUSCULAR | Status: AC
  Administered 2018-07-09: 1 g via INTRAVENOUS
  Filled 2018-07-09: qty 10

## 2018-07-09 MED ORDER — OXYCODONE HCL 5 MG/5ML PO SOLN: 5.0000 mg | Freq: Once | ORAL | Status: DC | PRN

## 2018-07-09 SURGICAL SUPPLY — 43 items
APPLIER CLIP 5 13 M/L LIGAMAX5 (MISCELLANEOUS) ×3
APPLIER CLIP ROT 10 11.4 M/L (STAPLE)
BENZOIN TINCTURE PRP APPL 2/3 (GAUZE/BANDAGES/DRESSINGS) IMPLANT
CABLE HIGH FREQUENCY MONO STRZ (ELECTRODE) ×3 IMPLANT
CHLORAPREP W/TINT 26 (MISCELLANEOUS) ×3 IMPLANT
CLIP APPLIE 5 13 M/L LIGAMAX5 (MISCELLANEOUS) ×1 IMPLANT
CLIP APPLIE ROT 10 11.4 M/L (STAPLE) IMPLANT
CLOSURE WOUND 1/2 X4 (GAUZE/BANDAGES/DRESSINGS) ×1
COVER MAYO STAND STRL (DRAPES) IMPLANT
COVER SURGICAL LIGHT HANDLE (MISCELLANEOUS) ×3 IMPLANT
COVER WAND RF STERILE (DRAPES) IMPLANT
DECANTER SPIKE VIAL GLASS SM (MISCELLANEOUS) ×3 IMPLANT
DERMABOND ADVANCED (GAUZE/BANDAGES/DRESSINGS) ×2
DERMABOND ADVANCED .7 DNX12 (GAUZE/BANDAGES/DRESSINGS) ×1 IMPLANT
DEVICE TROCAR PUNCTURE CLOSURE (ENDOMECHANICALS) IMPLANT
DRAPE C-ARM 42X120 X-RAY (DRAPES) IMPLANT
ELECT REM PT RETURN 15FT ADLT (MISCELLANEOUS) ×3 IMPLANT
GLOVE BIO SURGEON STRL SZ7 (GLOVE) ×3 IMPLANT
GLOVE BIOGEL PI IND STRL 7.5 (GLOVE) ×1 IMPLANT
GLOVE BIOGEL PI INDICATOR 7.5 (GLOVE) ×2
GOWN STRL REUS W/TWL LRG LVL3 (GOWN DISPOSABLE) ×3 IMPLANT
GOWN STRL REUS W/TWL XL LVL3 (GOWN DISPOSABLE) ×6 IMPLANT
HEMOSTAT SNOW SURGICEL 2X4 (HEMOSTASIS) IMPLANT
KIT BASIN OR (CUSTOM PROCEDURE TRAY) ×3 IMPLANT
KIT TURNOVER KIT A (KITS) IMPLANT
POUCH RETRIEVAL ECOSAC 10 (ENDOMECHANICALS) ×1 IMPLANT
POUCH RETRIEVAL ECOSAC 10MM (ENDOMECHANICALS) ×2
PROTECTOR NERVE ULNAR (MISCELLANEOUS) IMPLANT
SCISSORS LAP 5X35 DISP (ENDOMECHANICALS) ×3 IMPLANT
SET CHOLANGIOGRAPH MIX (MISCELLANEOUS) IMPLANT
SET IRRIG TUBING LAPAROSCOPIC (IRRIGATION / IRRIGATOR) ×3 IMPLANT
SET TUBE SMOKE EVAC HIGH FLOW (TUBING) ×3 IMPLANT
SLEEVE XCEL OPT CAN 5 100 (ENDOMECHANICALS) ×6 IMPLANT
STRIP CLOSURE SKIN 1/2X4 (GAUZE/BANDAGES/DRESSINGS) ×2 IMPLANT
SUT MNCRL AB 4-0 PS2 18 (SUTURE) ×3 IMPLANT
SUT VICRYL 0 TIES 12 18 (SUTURE) IMPLANT
SUT VICRYL 0 UR6 27IN ABS (SUTURE) ×6 IMPLANT
TOWEL OR 17X26 10 PK STRL BLUE (TOWEL DISPOSABLE) ×3 IMPLANT
TOWEL OR NON WOVEN STRL DISP B (DISPOSABLE) ×3 IMPLANT
TRAY LAPAROSCOPIC (CUSTOM PROCEDURE TRAY) ×3 IMPLANT
TROCAR BLADELESS OPT 5 100 (ENDOMECHANICALS) ×3 IMPLANT
TROCAR XCEL BLUNT TIP 100MML (ENDOMECHANICALS) ×3 IMPLANT
TROCAR XCEL NON-BLD 11X100MML (ENDOMECHANICALS) IMPLANT

## 2018-07-09 NOTE — ED Notes (Signed)
Spoke with short stay, informs nurse on way to get pt for sx. Reports it is ok for pt belongings to transport with pt. This nurse informs short stay consent has been signed and pt has been wiped down with chg wipes.

## 2018-07-09 NOTE — ED Notes (Signed)
US at bedside

## 2018-07-09 NOTE — ED Triage Notes (Signed)
Pt reports having abdominal pain that started around 10pm yesterday. Pt reports 4-5 episodes of vomiting.

## 2018-07-09 NOTE — Discharge Instructions (Signed)
[facility] LAPAROSCOPIC SURGERY: POST OP INSTRUCTIONS  Always review your discharge instruction sheet given to you by the facility where your surgery was performed. IF YOU HAVE DISABILITY OR FAMILY LEAVE FORMS, YOU MUST BRING THEM TO THE OFFICE FOR PROCESSING.   DO NOT GIVE THEM TO YOUR DOCTOR.  1. A prescription for pain medication may be given to you upon discharge.  Take your pain medication as prescribed, if needed.  If narcotic pain medicine is not needed, then you may take acetaminophen (Tylenol), naprosyn (Alleve), or ibuprofen (Advil) as needed. 2. Take your usually prescribed medications unless otherwise directed. 3. If you need a refill on your pain medication, please contact your pharmacy.  They will contact our office to request authorization. Prescriptions will not be filled after 5pm or on week-ends. 4. You should follow a light diet the first few days after arrival home, such as soup and crackers, etc.  Be sure to include lots of fluids daily. 5. Most patients will experience some swelling and bruising in the area of the incisions.  Ice packs will help.  Swelling and bruising can take several days to resolve.  6. It is common to experience some constipation if taking pain medication after surgery.  Increasing fluid intake and taking a stool softener (such as Colace) will usually help or prevent this problem from occurring.  A mild laxative (Milk of Magnesia or Miralax) should be taken according to package instructions if there are no bowel movements after 48 hours. 7. Unless discharge instructions indicate otherwise, you may remove your bandages 48 hours after surgery, and you may shower at that time.  You may have steri-strips (small skin tapes) in place directly over the incision.  These strips should be left on the skin for 7-10 days.  If your surgeon used skin glue on the incision, you may shower in 24 hours.  The glue will flake  off over the next 2-3 weeks.  Any sutures or staples will be removed at the office during your follow-up visit. 8. ACTIVITIES:  You may resume regular (light) daily activities beginning the next day--such as daily self-care, walking, climbing stairs--gradually increasing activities as tolerated.  You may have sexual intercourse when it is comfortable.  Refrain from any heavy lifting or straining until approved by your doctor. a. You may drive when you are no longer taking prescription pain medication, you can comfortably wear a seatbelt, and you can safely maneuver your car and apply brakes. b. RETURN TO WORK:  __________________________________________________________ 9. You should see your doctor in the office for a follow-up appointment approximately 2-3 weeks after your surgery.  Make sure that you call for this appointment within a day or two after you arrive home to insure a convenient appointment time. 10. OTHER INSTRUCTIONS: __________________________________________________________________________________________________________________________ __________________________________________________________________________________________________________________________ WHEN TO CALL YOUR DOCTOR: 1. Fever over 101.0 2. Inability to urinate 3. Continued bleeding from incision. 4. Increased pain, redness, or drainage from the incision. 5. Increasing abdominal pain  The clinic staff is available to answer your questions during regular business hours.  Please don't hesitate to call and ask to speak to one of the nurses for clinical concerns.  If you have a medical emergency, go to the nearest emergency room or call 911.  A surgeon from [facility] is always on call at the hospital. [address], [address], [place], [place]  [address] ? [address] [address], [place], [place]   [address] [phone] ? [phone] ? [phone]   [phone] Web site: [email or web address]  

## 2018-07-09 NOTE — Anesthesia Procedure Notes (Addendum)
Procedure Name: Intubation Date/Time: 07/09/2018 10:30 AM Performed by: Audry Pili, MD Pre-anesthesia Checklist: Patient identified, Emergency Drugs available, Suction available and Patient being monitored Patient Re-evaluated:Patient Re-evaluated prior to induction Oxygen Delivery Method: Circle system utilized Preoxygenation: Pre-oxygenation with 100% oxygen Induction Type: IV induction Ventilation: Mask ventilation without difficulty Laryngoscope Size: Miller and 2 Grade View: Grade II Tube type: Oral Tube size: 7.5 mm Number of attempts: 2 (First attempt by CRNA with Mac 3, grade 3 view. Second attempt by Dr. Fransisco Beau with Mil 2, grade 2a view.) Airway Equipment and Method: Stylet and Oral airway Placement Confirmation: ETT inserted through vocal cords under direct vision,  positive ETCO2 and breath sounds checked- equal and bilateral Secured at: 22 cm Tube secured with: Tape Dental Injury: Teeth and Oropharynx as per pre-operative assessment  Comments: Tightly curled epiglottis partially obstructing laryngeal view.

## 2018-07-09 NOTE — H&P (Signed)
Jesus Wagner is an 83 y.o. male.   Chief Complaint: ruq pain HPI: 52 yom with history of htn and bph, prior IH repair presents with first episode of ruq pain that radiates to back that started at 10 pm last night. This progressed to both sides.  This was after eating. Attempted zantac without relief.  He has had constipation and that is without change.  He has one episode emesis. Some nausea. Pain has not gone away in the er despite pain medication.  He first had ct scan that showed cholelithiaisis, also had rml nodule recommending fu.  This was followed by [place] that shows cholelithiasis, wall of 5-6 mm, tender over gb during [place].  I was asked to see him.  His tb is 1.4  Past Medical History:  Diagnosis Date  . Hypertension   . Prostate enlargement     Past Surgical History:  Procedure Laterality Date  . COLONOSCOPY     [id number]  . HERNIA REPAIR     inguinal  . laser varicose vein    . TOTAL HIP ARTHROPLASTY Left 08/05/2013   Procedure: LEFT TOTAL HIP ARTHROPLASTY ANTERIOR APPROACH;  Surgeon: Kathryne Hitch, MD;  Location: [facility];  Service: Orthopedics;  Laterality: Left;  Marland Kitchen VASECTOMY      History reviewed. No pertinent family history. Social History:  reports that he has never smoked. He has never used smokeless tobacco. He reports that he does not drink alcohol or use drugs.  Allergies:  Allergies  Allergen Reactions  . Aspirin     Stomach upset   . Codeine Nausea Only    meds reviewed  Results for orders placed or performed during the hospital encounter of 07/09/18 (from the past 48 hour(s))  CBC with Differential     Status: None   Collection Time: 07/09/18  3:47 AM  Result Value Ref Range   WBC 7.8 4.0 - 10.5 K/uL   RBC 4.42 4.22 - 5.81 MIL/uL   Hemoglobin 14.5 13.0 - 17.0 g/dL   HCT 98.1 19.1 - 47.8 %   MCV 92.5 80.0 - 100.0 fL   MCH 32.8 26.0 - 34.0 pg   MCHC 35.5 30.0 - 36.0 g/dL   RDW 29.5 62.1 - 30.8 %   Platelets 151 150 - 400 K/uL   nRBC 0.0 0.0 - 0.2 %    Neutrophils Relative % 85 %   Neutro Abs 6.7 1.7 - 7.7 K/uL   Lymphocytes Relative 11 %   Lymphs Abs 0.8 0.7 - 4.0 K/uL   Monocytes Relative 3 %   Monocytes Absolute 0.2 0.1 - 1.0 K/uL   Eosinophils Relative 0 %   Eosinophils Absolute 0.0 0.0 - 0.5 K/uL   Basophils Relative 0 %   Basophils Absolute 0.0 0.0 - 0.1 K/uL   Immature Granulocytes 1 %   Abs Immature Granulocytes 0.05 0.00 - 0.07 K/uL    Comment: Performed at [facility], [address] [place], [place] [address]  Comprehensive metabolic panel     Status: Abnormal   Collection Time: 07/09/18  3:47 AM  Result Value Ref Range   Sodium 137 135 - 145 mmol/L   Potassium 3.7 3.5 - 5.1 mmol/L   Chloride 105 98 - 111 mmol/L   CO2 23 22 - 32 mmol/L   Glucose, Bld 138 (H) 70 - 99 mg/dL   BUN 14 8 - 23 mg/dL   Creatinine, Ser 6.96 0.61 - 1.24 mg/dL   Calcium 9.3 8.9 - 10.3  mg/dL   Total Protein 7.5 6.5 - 8.1 g/dL   Albumin 4.6 3.5 - 5.0 g/dL   AST 22 15 - 41 U/L   ALT 20 0 - 44 U/L   Alkaline Phosphatase 84 38 - 126 U/L   Total Bilirubin 1.4 (H) 0.3 - 1.2 mg/dL   GFR calc non Af Amer >60 >60 mL/min   GFR calc Af Amer >60 >60 mL/min   Anion gap 9 5 - 15    Comment: Performed at [facility], [address] [place], [place] [address]  Lipase, blood     Status: None   Collection Time: 07/09/18  3:47 AM  Result Value Ref Range   Lipase 22 11 - 51 U/L    Comment: Performed at [facility], [address], [place], [place] [address]  I-stat troponin, ED     Status: None   Collection Time: 07/09/18  3:51 AM  Result Value Ref Range   Troponin i, poc 0.00 0.00 - 0.08 ng/mL   Comment 3            Comment: Due to the release kinetics of cTnI, a negative result within the first hours of the onset of symptoms does not rule out myocardial infarction with certainty. If myocardial infarction is still suspected, repeat the test at appropriate intervals.   Urinalysis,  Routine w reflex microscopic     Status: Abnormal   Collection Time: 07/09/18  6:20 AM  Result Value Ref Range   Color, Urine COLORLESS (A) YELLOW   APPearance CLEAR CLEAR   Specific Gravity, Urine 1.019 1.005 - 1.030   pH 8.0 5.0 - 8.0   Glucose, UA NEGATIVE NEGATIVE mg/dL   Hgb urine dipstick NEGATIVE NEGATIVE   Bilirubin Urine NEGATIVE NEGATIVE   Ketones, ur 5 (A) NEGATIVE mg/dL   Protein, ur NEGATIVE NEGATIVE mg/dL   Nitrite NEGATIVE NEGATIVE   Leukocytes,Ua NEGATIVE NEGATIVE    Comment: Performed at [facility], [address] [place], [place] [address]   Ct Abdomen Pelvis W Contrast  Result Date: 07/09/2018 CLINICAL DATA:  Abdominal pain, acute, generalized. Pain began at 10 p.m. last evening. EXAM: CT ABDOMEN AND PELVIS WITH CONTRAST TECHNIQUE: Multidetector CT imaging of the abdomen and pelvis was performed using the standard protocol following bolus administration of intravenous contrast. CONTRAST:  OMNIPAQUE IOHEXOL 300 MG/ML  SOLN COMPARISON:  None. FINDINGS: Lower chest: 2 nodules are present in the right middle lobe, the larger measures 6 mm. These are well-defined and smooth. Calcified nodule in the left lower lobe measures 10 mm. There is mild dependent atelectasis in both lungs. The heart size is normal. Coronary artery calcifications are present. Hepatobiliary: There is fatty infiltration liver. Punctate calcifications are present. Subcentimeter cysts are noted. Multiple layering gallstones are present. No definite inflammatory changes evident. Common bile duct is within normal limits. Pancreas: The pancreas is mildly atrophic without focal lesion or inflammation. Spleen: Multiple punctate calcifications. Are present No other discrete lesion throughout the spleen is present. Adrenals/Urinary Tract: The adrenal glands are normal bilaterally. A subcentimeter cyst is noted anteriorly in the right kidney. No other focal mass lesion is present. There is no  stone or obstruction. Ureters are within normal limits bilaterally. The urinary bladder is unremarkable. Stomach/Bowel: Stomach and duodenum are within normal limits. Small bowel is unremarkable. Terminal ileum is normal. Appendix is visualized and normal. The ascending and transverse colon are within normal limits. Descending and sigmoid colon are mostly  collapsed. Vascular/Lymphatic: Atherosclerotic calcifications are present in the aorta without aneurysm. No significant retroperitoneal adenopathy is present. Reproductive: Prostate is enlarged, measuring 5.5 cm in transverse diameter. No inflammatory changes are evident. Other: No abdominal wall hernia or abnormality. No abdominopelvic ascites. Musculoskeletal: Vertebral body heights and alignment are maintained. No focal lytic or blastic lesions are present. Left total hip arthroplasty is present. The right hip is within normal limits. Bony pelvis is unremarkable. IMPRESSION: 1. No acute or focal lesion to explain the patient's abdominal pain. 2. Cholelithiasis without inflammatory changes to suggest cholelithiasis cholecystitis. 3. Calcifications throughout the spleen and calcified nodule in the left lower lobe, consistent with prior granulomatous disease. 4. Well-defined 6 mm nodule in the right middle lobe is likely also related to prior granulomatous disease. Non-contrast chest CT at 3-6 months is recommended. If the nodules are stable at time of repeat CT, then future CT at 18-24 months (from today's scan) is considered optional for low-risk patients, but is recommended for high-risk patients. This recommendation follows the consensus statement: Guidelines for Management of Incidental Pulmonary Nodules Detected on CT Images: From the Fleischner Society 2017; Radiology 2017; 284:228-243. 5. Enlarged prostate. Electronically Signed   By: Marin Roberts M.D.   On: 07/09/2018 05:56   US Abdomen Limited Ruq  Result Date: 07/09/2018 CLINICAL DATA:   Abdominal pain and vomiting.  Cholelithiasis by CT. EXAM: ULTRASOUND ABDOMEN LIMITED RIGHT UPPER QUADRANT COMPARISON:  CT of the abdomen and pelvis earlier today per FINDINGS: Gallbladder: Multiple gallstones identified in the gallbladder. The gallbladder is not distended but does demonstrate some probable wall thickening approaching 5-6 mm. The patient was tender over the region of the gallbladder and findings may relate to chronic cholecystitis. No pericholecystic fluid identified. Common bile duct: Diameter: 5 mm Liver: Mildly increased parenchymal echogenicity likely reflects mild steatosis. No overt cirrhosis, focal mass or intrahepatic biliary dilatation. Portal vein is patent on color Doppler imaging with normal direction of blood flow towards the liver. IMPRESSION: Cholelithiasis with nondistended gallbladder, gallbladder wall thickening and some tenderness over the region of the gallbladder during examination. Findings may relate to chronic cholecystitis. There is no evidence pericholecystic fluid or biliary obstruction. Electronically Signed   By: Irish Lack M.D.   On: 07/09/2018 07:53    Review of Systems  Gastrointestinal: Positive for abdominal pain, constipation, nausea and vomiting.  Genitourinary: Positive for flank pain.  All other systems reviewed and are negative.   Blood pressure (!) 148/84, pulse 90, temperature 98 F (36.7 C), temperature source Oral, resp. rate (!) 21, height 6' (1.829 m), weight 86.2 kg, SpO2 97 %. Physical Exam  Vitals reviewed. Constitutional: He is oriented to person, place, and time. He appears well-developed and well-nourished.  HENT:  Head: Normocephalic and atraumatic.  Right Ear: External ear normal.  Left Ear: External ear normal.  Mouth/Throat: Oropharynx is clear and moist.  Eyes: Pupils are equal, round, and reactive to light. No scleral icterus.  Neck: Neck supple.  Cardiovascular: Normal rate, regular rhythm, normal heart sounds and  intact distal pulses.  Respiratory: Effort normal and breath sounds normal.  GI: Soft. Bowel sounds are normal. He exhibits no distension. There is abdominal tenderness in the right upper quadrant.  Musculoskeletal:        General: No tenderness.  Lymphadenopathy:    He has no cervical adenopathy.  Neurological: He is alert and oriented to person, place, and time.  Skin: Skin is warm and dry. He is not diaphoretic.  Psychiatric: He has  a normal mood and affect. His behavior is normal.     Assessment/Plan Chronic calculous cholecystitis I discussed the procedure in detail.  We discussed the risks and benefits of a laparoscopic cholecystectomy and possible cholangiogram including, but not limited to bleeding, infection, injury to surrounding structures such as the intestine or liver, bile leak, retained gallstones, need to convert to an open procedure, prolonged diarrhea, blood clots such as  DVT, common bile duct injury, anesthesia risks, and possible need for additional procedures.  The likelihood of improvement in symptoms and return to the patient's normal status is good. We discussed the typical post-operative recovery course.  I attempted to call his wife Jamesetta Sohyllis and have been unable to reach her Emelia LoronMatthew Nica Friske, MD 07/09/2018, 9:06 AM

## 2018-07-09 NOTE — Anesthesia Preprocedure Evaluation (Addendum)
Anesthesia Evaluation  Patient identified by MRN, date of birth, ID band Patient awake    Reviewed: Allergy & Precautions, NPO status , Patient's Chart, lab work & pertinent test results  History of Anesthesia Complications Negative for: history of anesthetic complications  Airway Mallampati: II  TM Distance: >3 FB Neck ROM: Full    Dental  (+) Dental Advisory Given   Pulmonary neg pulmonary ROS,    breath sounds clear to auscultation       Cardiovascular hypertension, Pt. on medications  Rhythm:Regular Rate:Normal     Neuro/Psych negative neurological ROS  negative psych ROS   GI/Hepatic Neg liver ROS,  Acute cholecystitis    Endo/Other  negative endocrine ROS  Renal/GU negative Renal ROS     Musculoskeletal  (+) Arthritis ,   Abdominal   Peds  Hematology negative hematology ROS (+)   Anesthesia Other Findings   Reproductive/Obstetrics                            Anesthesia Physical Anesthesia Plan  ASA: II and emergent  Anesthesia Plan: General   Post-op Pain Management:    Induction: Intravenous and Rapid sequence  PONV Risk Score and Plan: 4 or greater and Treatment may vary due to age or medical condition, Ondansetron and Propofol infusion  Airway Management Planned: Oral ETT  Additional Equipment: None  Intra-op Plan:   Post-operative Plan: Extubation in OR  Informed Consent: I have reviewed the patients History and Physical, chart, labs and discussed the procedure including the risks, benefits and alternatives for the proposed anesthesia with the patient or authorized representative who has indicated his/her understanding and acceptance.     Dental advisory given  Plan Discussed with: CRNA and Anesthesiologist  Anesthesia Plan Comments:        Anesthesia Quick Evaluation

## 2018-07-09 NOTE — Op Note (Addendum)
Preoperative diagnosis: Chronic cholecystitis Postoperative diagnosis acute on chronic cholecystitis Procedure: Laparoscopic cholecystectomy Surgeon: Dr. Harden Mo Assistant: Dr. Wenda Low Anesthesia: General Estimated blood loss: Minimal Drains: None Specimens: Gallbladder to pathology Complications: None Sponge and needle counts correct at completion Disposition to recovery stable condition  Indications: This is an 83 year old otherwise very healthy male who presents with less than 24 hours of right upper quadrant pain.  His ultrasound shows multiple gallstones as well as a 5 to 6 mm gallbladder wall.  He remains tender in the right upper quadrant.  I discussed going to the operating room for a laparoscopic cholecystectomy with possible cholangiogram.  I discussed this with his wife over the phone prior to beginning.  Procedure: After informed consent was obtained the patient was taken the operative room.  He had already been given antibiotics.  SCDs were in place.  He was placed under general anesthesia without complication.  His abdomen was prepped and draped in the standard sterile surgical fashion.  A surgical timeout was then performed.  Infiltrated Marcaine below his umbilicus.  I made a vertical incision.  I grasped the fascia with a Kocher clamp.  I incised the fascia and entered into the peritoneum bluntly.  This was done without injury.  I placed a 0 Vicryl pursestring suture and inserted a Hassan trocar.  The abdomen was insufflated 15 mmHg pressure.  I then inserted 3 further 5 mm trochars in the epigastrium and right side of the abdomen under direct vision without complication.  The gallbladder was noted to have evidence of cholecystitis.  I had to aspirate the gallbladder just to be able to grasp it.  I then retracted it cephalad and lateral.  I was able to dissect in the triangle and obtain the critical view of safety.  His cystic duct was very short and I elected not to do a  cholangiogram due to that.  I clipped the artery 3 times and divided it leaving 2 clips in place.  I treated the cystic duct in a similar fashion.  The gallbladder was then removed from the liver bed without difficulty.  It was placed in a retrieval bag.  I then was able to obtain hemostasis.  I irrigated and this was clear.  I then desufflated the abdomen using the smoke evacuation device and the trochars in place.  Once this had been completed I removed all the trochars and the gallbladder and the retrieval bag were all removed as well.  I then tied down my pursestring suture.  I placed an additional 0 Vicryl and then his umbilical incision.  I then closed these with 4-0 Monocryl and glue.  He tolerated this well was extubated and transferred to recovery stable. I updated his wife Jamesetta So over the phone at completion of the case

## 2018-07-09 NOTE — Transfer of Care (Signed)
Immediate Anesthesia Transfer of Care Note  Patient: Jesus Wagner  Procedure(s) Performed: Procedure(s): LAPAROSCOPIC CHOLECYSTECTOMY WITH INTRAOPERATIVE CHOLANGIOGRAM (N/A)  Patient Location: PACU  Anesthesia Type:General  Level of Consciousness:  sedated, patient cooperative and responds to stimulation  Airway & Oxygen Therapy:Patient Spontanous Breathing and Patient connected to face mask oxgen  Post-op Assessment:  Report given to PACU RN and Post -op Vital signs reviewed and stable  Post vital signs:  Reviewed and stable  Last Vitals:  Vitals:   07/09/18 0900 07/09/18 0919  BP: (!) 152/75 (!) 161/70  Pulse: 94 70  Resp: 16 18  Temp:    SpO2: 97% 98%    Complications: No apparent anesthesia complications

## 2018-07-09 NOTE — ED Notes (Signed)
Patient transported to CT 

## 2018-07-09 NOTE — ED Notes (Signed)
OR at bedside to transport pt to sx.

## 2018-07-09 NOTE — ED Provider Notes (Signed)
TIME SEEN: 3:27 AM  CHIEF COMPLAINT: Abdominal pain, nausea and vomiting  HPI: Patient is an 83 year old male with history of hypertension who presents to the emergency department with epigastric abdominal pain that he describes as sharp, severe that radiates into the right and left upper quadrants into the bilateral flanks that started at 10 PM last night.  States he thought initially this was indigestion and took ranitidine x 2, drank milk, ate ice cream, ate bread.  States symptoms only progressively worsened and now he is vomiting.  Does report over the past week he has been constipated but has had 3 bowel movements tonight.  First bowel movement was hard and then the next 2 were soft.  No blood or melena.  No previous abdominal surgery.  No fevers.  No chest pain, shortness of breath, cough.  No dysuria or hematuria.  Denies previous history of gallstones.  Has never had similar pain.  Patient reports his wife drove him to the emergency room.  ROS: See HPI Constitutional: no fever  Eyes: no drainage  ENT: no runny nose   Cardiovascular:  no chest pain  Resp: no SOB  GI:  vomiting GU: no dysuria Integumentary: no rash  Allergy: no hives  Musculoskeletal: no leg swelling  Neurological: no slurred speech ROS otherwise negative  PAST MEDICAL HISTORY/PAST SURGICAL HISTORY:  Past Medical History:  Diagnosis Date  . Hypertension   . Prostate enlargement     MEDICATIONS:  Prior to Admission medications   Medication Sig Start Date End Date Taking? Authorizing Provider  cholecalciferol (VITAMIN D) 1000 UNITS tablet Take 1,000 Units by mouth daily.    [provider]  gabapentin (NEURONTIN) 300 MG capsule  10/29/17   [provider]  lisinopril (PRINIVIL,ZESTRIL) 40 MG tablet Take 40 mg by mouth daily at 12 noon.  03/26/11   [provider]  losartan (COZAAR) 100 MG tablet  12/09/16   [provider]  neomycin-polymyxin-hydrocortisone (CORTISPORIN) OTIC  solution Use 1-2 drops to toenail as needed 03/30/18   Asencion Islam, DPM  ondansetron (ZOFRAN ODT) 4 MG disintegrating tablet Take 1 tablet (4 mg total) by mouth 4 (four) times daily as needed for nausea or vomiting. Patient not taking: Reported on 03/30/2018 08/08/13   Kathryne Hitch, MD  oxyCODONE-acetaminophen (ROXICET) 5-325 MG per tablet Take 1-2 tablets by mouth every 4 (four) hours as needed for severe pain. Patient not taking: Reported on 03/30/2018 08/06/13   Kathryne Hitch, MD  rivaroxaban (XARELTO) 10 MG TABS tablet Take 1 tablet (10 mg total) by mouth daily with breakfast. Patient not taking: Reported on 03/30/2018 08/06/13   Kathryne Hitch, MD  rosuvastatin (CRESTOR) 10 MG tablet  02/05/15   [provider]  tamsulosin (FLOMAX) 0.4 MG CAPS capsule Take 0.4 mg by mouth daily. 7 days before procedure and 7 days after procedure.    [provider]    ALLERGIES:  Allergies  Allergen Reactions  . Aspirin     Stomach upset   . Codeine Nausea Only    SOCIAL HISTORY:  Social History   Tobacco Use  . Smoking status: Never Smoker  . Smokeless tobacco: Never Used  Substance Use Topics  . Alcohol use: No    FAMILY HISTORY: History reviewed. No pertinent family history.  EXAM: BP (!) 176/81 (BP Location: Left Arm)   Pulse 66   Temp 97.9 F (36.6 C) (Oral)   Resp 17   Ht 6' (1.829 m)   Wt 86.2  kg   SpO2 99%   BMI 25.77 kg/m  CONSTITUTIONAL: Alert and oriented and responds appropriately to questions.  Elderly, appears very uncomfortable, actively vomiting HEAD: Normocephalic EYES: Conjunctivae clear, pupils appear equal, EOMI ENT: normal nose; moist mucous membranes NECK: Supple, no meningismus, no nuchal rigidity, no LAD  CARD: RRR; S1 and S2 appreciated; no murmurs, no clicks, no rubs, no gallops RESP: Normal chest excursion without splinting or tachypnea; breath sounds clear and equal bilaterally; no wheezes, no rhonchi, no rales,  no hypoxia or respiratory distress, speaking full sentences ABD/GI: Normal bowel sounds; non-distended; soft, tender to palpation throughout the abdomen worse throughout the upper quadrants, no significant tenderness at McBurney's point, negative Murphy sign BACK:  The back appears normal and is non-tender to palpation, there is no CVA tenderness EXT: Normal ROM in all joints; non-tender to palpation; no edema; normal capillary refill; no cyanosis, no calf tenderness or swelling    SKIN: Normal color for age and race; warm; no rash NEURO: Moves all extremities equally PSYCH: The patient's mood and manner are appropriate. Grooming and personal hygiene are appropriate.  MEDICAL DECISION MAKING: Patient here with abdominal pain that is generalized but worse in the upper quadrants.  Differential includes gastritis, cholecystitis, choledocholithiasis, cholangitis, pancreatitis, colitis, bowel obstruction, less likely appendicitis or diverticulitis.  Will obtain labs, urine.  Low suspicion for ACS but will check EKG, troponin.  Will give fentanyl, Zofran for symptomatic relief.  Will obtain a CT of the abdomen pelvis given pain is generalized and he is tender diffusely.  ED PROGRESS: CT scan shows cholelithiasis without inflammatory changes.  No other acute abnormality.  Patient has had pain that has been very difficult to control and has required IV fentanyl, morphine and Dilaudid.  His labs are unremarkable.  Negative troponin and EKG that does not show any ischemic changes.  Will obtain right upper quadrant ultrasound for further evaluation of the gallbladder.  Will repeat Dilaudid, Zofran and also give Bentyl and Protonix.   8:00 AM  Pt's ultrasound shows chronic cholecystitis.  Will discuss with general surgery given patient's intractable pain.  Will give IV ceftriaxone and Flagyl.  Updated patient who agrees.  Will give third round of Dilaudid for pain control.   I reviewed all nursing notes, vitals,  pertinent previous records, EKGs, lab and urine results, imaging (as available).     EKG Interpretation  Date/Time:  Friday July 09 2018 03:43:43 EDT Ventricular Rate:  82 PR Interval:    QRS Duration: 102 QT Interval:  404 QTC Calculation: 472 R Axis:   78 Text Interpretation:  Sinus rhythm No old tracing to compare Confirmed by Ward, Baxter HireKristen [phone] on 07/09/2018 3:56:33 AM           Ward, Layla MawKristen N, DO 07/09/18 [id number]

## 2018-07-09 NOTE — Anesthesia Postprocedure Evaluation (Signed)
Anesthesia Post Note  Patient: Jesus Wagner  Procedure(s) Performed: LAPAROSCOPIC CHOLECYSTECTOMY (N/A Abdomen)     Patient location during evaluation: PACU Anesthesia Type: General Level of consciousness: awake and alert Pain management: pain level controlled Vital Signs Assessment: post-procedure vital signs reviewed and stable Respiratory status: spontaneous breathing, nonlabored ventilation and respiratory function stable Cardiovascular status: blood pressure returned to baseline and stable Postop Assessment: no apparent nausea or vomiting Anesthetic complications: no    Last Vitals:  Vitals:   07/09/18 1245 07/09/18 1326  BP: (!) 164/75 (!) 155/78  Pulse: 85 78  Resp: 14 18  Temp:  36.7 C  SpO2: 99% 100%    Last Pain:  Vitals:   07/09/18 1326  TempSrc: Oral  PainSc: 0-No pain                 Beryle Lathe

## 2018-07-10 LAB — COMPREHENSIVE METABOLIC PANEL
ALT: 41 U/L (ref 0–44)
AST: 41 U/L (ref 15–41)
Albumin: 3.3 g/dL — ABNORMAL LOW (ref 3.5–5.0)
Alkaline Phosphatase: 66 U/L (ref 38–126)
Anion gap: 6 (ref 5–15)
BUN: 12 mg/dL (ref 8–23)
CO2: 23 mmol/L (ref 22–32)
Calcium: 8.2 mg/dL — ABNORMAL LOW (ref 8.9–10.3)
Chloride: 108 mmol/L (ref 98–111)
Creatinine, Ser: 0.82 mg/dL (ref 0.61–1.24)
GFR calc Af Amer: >60 mL/min (ref >60)
GFR calc non Af Amer: >60 mL/min (ref >60)
Glucose, Bld: 101 mg/dL — ABNORMAL HIGH (ref 70–99)
Potassium: 3.7 mmol/L (ref 3.5–5.1)
Sodium: 137 mmol/L (ref 135–145)
Total Bilirubin: 2 mg/dL — ABNORMAL HIGH (ref 0.3–1.2)
Total Protein: 5.7 g/dL — ABNORMAL LOW (ref 6.5–8.1)

## 2018-07-10 MED ORDER — OXYCODONE HCL 5 MG PO TABS: 5.0000 mg | ORAL_TABLET | Freq: Four times a day (QID) | ORAL | 0 refills | Status: DC | PRN

## 2018-07-10 NOTE — Plan of Care (Signed)
Review discharge instructions with patient; copy given. IV removed. Patient ready for discharge.

## 2018-07-10 NOTE — Discharge Summary (Signed)
Physician Discharge Summary  Patient ID: Jesus Wagner MRN: [id number] DOB/AGE: Nov 22, 1935 83 y.o.  Admit date: 07/09/2018 Discharge date: 07/10/2018  Admission Diagnoses:  Discharge Diagnoses:  Active Problems:   S/P laparoscopic cholecystectomy   Discharged Condition: good  Hospital Course: the pt underwent llap chole. He tolerated surgery well. On pod 1 he was ready for d/c home  Consults: None  Significant Diagnostic Studies: none  Treatments: surgery: as above  Discharge Exam: Blood pressure (!) 105/53, pulse 69, temperature 98.7 F (37.1 C), temperature source Oral, resp. rate 18, height 6' (1.829 m), weight 86.2 kg, SpO2 94 %. General appearance: alert and cooperative Resp: clear to auscultation bilaterally Cardio: regular rate and rhythm GI: soft, nontender  Disposition: Discharge disposition: 01-Home or Self Care       Discharge Instructions    Call MD for:  difficulty breathing, headache or visual disturbances   Complete by:  As directed    Call MD for:  extreme fatigue   Complete by:  As directed    Call MD for:  hives   Complete by:  As directed    Call MD for:  persistant dizziness or light-headedness   Complete by:  As directed    Call MD for:  persistant nausea and vomiting   Complete by:  As directed    Call MD for:  redness, tenderness, or signs of infection (pain, swelling, redness, odor or green/yellow discharge around incision site)   Complete by:  As directed    Call MD for:  severe uncontrolled pain   Complete by:  As directed    Call MD for:  temperature >100.4   Complete by:  As directed    Diet - low sodium heart healthy   Complete by:  As directed    Discharge instructions   Complete by:  As directed    May shower. No heavy lifting. Low fat diet   Increase activity slowly   Complete by:  As directed    No wound care   Complete by:  As directed      Allergies as of 07/10/2018      Reactions   Aspirin    Stomach upset    Codeine Nausea Only      Medication List    TAKE these medications   amLODipine 5 MG tablet Commonly known as:  NORVASC Take 7.5 mg by mouth every evening.   cholecalciferol 1000 units tablet Commonly known as:  VITAMIN D Take 2,000 Units by mouth daily.   losartan 100 MG tablet Commonly known as:  COZAAR Take 100 mg by mouth every evening.   oxyCODONE 5 MG immediate release tablet Commonly known as:  Oxy IR/ROXICODONE Take 1 tablet (5 mg total) by mouth every 6 (six) hours as needed for moderate pain.   rosuvastatin 10 MG tablet Commonly known as:  CRESTOR Take 10 mg by mouth daily.      Follow-up Information    [facility] In 3 weeks.   Specialty:  General Surgery Why:  call for appointment Contact information: [address] [place] [address] [phone]          Signed: Chevis Pretty III 07/10/2018, 8:30 AM

## 2018-07-10 NOTE — Progress Notes (Signed)
1 Day Post-Op   Subjective/Chief Complaint: No complaints. Tolerating diet and ready to go home   Objective: Vital signs in last 24 hours: Temp:  [97.8 F (36.6 C)-98.8 F (37.1 C)] 98.7 F (37.1 C) (04/18 0340) Pulse Rate:  [64-94] 69 (04/18 0340) Resp:  [11-18] 18 (04/18 0340) BP: (105-173)/(53-83) 105/53 (04/18 0340) SpO2:  [94 %-100 %] 94 % (04/18 0340) Last BM Date: 07/08/18  Intake/Output from previous day: 04/17 0701 - 04/18 0700 In: 2245.6 [P.O.:460; I.V.:1585.6; IV Piggyback:200] Out: 1975 [Urine:1975] Intake/Output this shift: No intake/output data recorded.  General appearance: alert and cooperative Resp: clear to auscultation bilaterally Cardio: regular rate and rhythm GI: soft, minimal tenderness. incisions look good  Lab Results:  Recent Labs    07/09/18 0347  WBC 7.8  HGB 14.5  HCT 40.9  PLT 151   BMET Recent Labs    07/09/18 0347 07/10/18 0309  NA 137 137  K 3.7 3.7  CL 105 108  CO2 23 23  GLUCOSE 138* 101*  BUN 14 12  CREATININE 0.73 0.82  CALCIUM 9.3 8.2*   PT/INR No results for input(s): LABPROT, INR in the last 72 hours. ABG No results for input(s): PHART, HCO3 in the last 72 hours.  Invalid input(s): PCO2, PO2  Studies/Results: Ct Abdomen Pelvis W Contrast  Result Date: 07/09/2018 CLINICAL DATA:  Abdominal pain, acute, generalized. Pain began at 10 p.m. last evening. EXAM: CT ABDOMEN AND PELVIS WITH CONTRAST TECHNIQUE: Multidetector CT imaging of the abdomen and pelvis was performed using the standard protocol following bolus administration of intravenous contrast. CONTRAST:  100mL OMNIPAQUE IOHEXOL 300 MG/ML  SOLN COMPARISON:  None. FINDINGS: Lower chest: 2 nodules are present in the right middle lobe, the larger measures 6 mm. These are well-defined and smooth. Calcified nodule in the left lower lobe measures 10 mm. There is mild dependent atelectasis in both lungs. The heart size is normal. Coronary artery calcifications are  present. Hepatobiliary: There is fatty infiltration liver. Punctate calcifications are present. Subcentimeter cysts are noted. Multiple layering gallstones are present. No definite inflammatory changes evident. Common bile duct is within normal limits. Pancreas: The pancreas is mildly atrophic without focal lesion or inflammation. Spleen: Multiple punctate calcifications. Are present No other discrete lesion throughout the spleen is present. Adrenals/Urinary Tract: The adrenal glands are normal bilaterally. A subcentimeter cyst is noted anteriorly in the right kidney. No other focal mass lesion is present. There is no stone or obstruction. Ureters are within normal limits bilaterally. The urinary bladder is unremarkable. Stomach/Bowel: Stomach and duodenum are within normal limits. Small bowel is unremarkable. Terminal ileum is normal. Appendix is visualized and normal. The ascending and transverse colon are within normal limits. Descending and sigmoid colon are mostly collapsed. Vascular/Lymphatic: Atherosclerotic calcifications are present in the aorta without aneurysm. No significant retroperitoneal adenopathy is present. Reproductive: Prostate is enlarged, measuring 5.5 cm in transverse diameter. No inflammatory changes are evident. Other: No abdominal wall hernia or abnormality. No abdominopelvic ascites. Musculoskeletal: Vertebral body heights and alignment are maintained. No focal lytic or blastic lesions are present. Left total hip arthroplasty is present. The right hip is within normal limits. Bony pelvis is unremarkable. IMPRESSION: 1. No acute or focal lesion to explain the patient's abdominal pain. 2. Cholelithiasis without inflammatory changes to suggest cholelithiasis cholecystitis. 3. Calcifications throughout the spleen and calcified nodule in the left lower lobe, consistent with prior granulomatous disease. 4. Well-defined 6 mm nodule in the right middle lobe is likely also related to prior  granulomatous disease. Non-contrast chest CT at 3-6 months is recommended. If the nodules are stable at time of repeat CT, then future CT at 18-24 months (from today's scan) is considered optional for low-risk patients, but is recommended for high-risk patients. This recommendation follows the consensus statement: Guidelines for Management of Incidental Pulmonary Nodules Detected on CT Images: From the Fleischner Society 2017; Radiology 2017; 284:228-243. 5. Enlarged prostate. Electronically Signed   By: Marin Roberts M.D.   On: 07/09/2018 05:56   US Abdomen Limited Ruq  Result Date: 07/09/2018 CLINICAL DATA:  Abdominal pain and vomiting.  Cholelithiasis by CT. EXAM: ULTRASOUND ABDOMEN LIMITED RIGHT UPPER QUADRANT COMPARISON:  CT of the abdomen and pelvis earlier today per FINDINGS: Gallbladder: Multiple gallstones identified in the gallbladder. The gallbladder is not distended but does demonstrate some probable wall thickening approaching 5-6 mm. The patient was tender over the region of the gallbladder and findings may relate to chronic cholecystitis. No pericholecystic fluid identified. Common bile duct: Diameter: 5 mm Liver: Mildly increased parenchymal echogenicity likely reflects mild steatosis. No overt cirrhosis, focal mass or intrahepatic biliary dilatation. Portal vein is patent on color Doppler imaging with normal direction of blood flow towards the liver. IMPRESSION: Cholelithiasis with nondistended gallbladder, gallbladder wall thickening and some tenderness over the region of the gallbladder during examination. Findings may relate to chronic cholecystitis. There is no evidence pericholecystic fluid or biliary obstruction. Electronically Signed   By: Irish Lack M.D.   On: 07/09/2018 07:53    Anti-infectives: Anti-infectives (From admission, onward)   Start     Dose/Rate Route Frequency Ordered Stop   07/09/18 0800  cefTRIAXone (ROCEPHIN) 1 g in sodium chloride 0.9 % 100 mL IVPB      1 g 200 mL/hr over 30 Minutes Intravenous  Once 07/09/18 0757 07/09/18 0916   07/09/18 0800  metroNIDAZOLE (FLAGYL) IVPB 500 mg     500 mg 100 mL/hr over 60 Minutes Intravenous  Once 07/09/18 0757 07/09/18 1021      Assessment/Plan: s/p Procedure(s): LAPAROSCOPIC CHOLECYSTECTOMY (N/A) Advance diet Discharge  LOS: 0 days    Chevis Pretty III 07/10/2018

## 2018-07-10 NOTE — Plan of Care (Signed)
Patient lying in bed this morning; complains of mild soreness/pain at incision sites. No other concerns voiced. Patient anticipating discharge today. Will continue to monitor.

## 2018-07-12 ENCOUNTER — Encounter (HOSPITAL_COMMUNITY): Payer: Self-pay | Admitting: General Surgery

## 2019-04-18 ENCOUNTER — Observation Stay (HOSPITAL_COMMUNITY)
Admission: EM | Admit: 2019-04-18 | Discharge: 2019-04-19 | Disposition: A | Discharge status: Hospice | Payer: Medicare PPO | Attending: Surgery | Admitting: Surgery

## 2019-04-18 ENCOUNTER — Encounter (HOSPITAL_COMMUNITY): Payer: Self-pay | Admitting: Emergency Medicine

## 2019-04-18 ENCOUNTER — Emergency Department (HOSPITAL_COMMUNITY): Payer: Medicare PPO | Admitting: Certified Registered"

## 2019-04-18 ENCOUNTER — Other Ambulatory Visit: Payer: Self-pay | Admitting: Gastroenterology

## 2019-04-18 ENCOUNTER — Other Ambulatory Visit: Payer: Self-pay

## 2019-04-18 ENCOUNTER — Encounter (HOSPITAL_COMMUNITY)
Admission: EM | Disposition: A | Discharge status: Hospice | Payer: Self-pay | Source: Home / Self Care | Attending: Emergency Medicine

## 2019-04-18 ENCOUNTER — Ambulatory Visit
Admission: RE | Admit: 2019-04-18 | Discharge: 2019-04-18 | Disposition: A | Discharge status: Home / Self Care | Payer: Medicare Other | Source: Ambulatory Visit | Attending: Gastroenterology | Admitting: Gastroenterology

## 2019-04-18 DIAGNOSIS — Z79899 Other long term (current) drug therapy: Secondary | ICD-10-CM | POA: Diagnosis not present

## 2019-04-18 DIAGNOSIS — Z20822 Contact with and (suspected) exposure to covid-19: Secondary | ICD-10-CM | POA: Insufficient documentation

## 2019-04-18 DIAGNOSIS — K3580 Unspecified acute appendicitis: Secondary | ICD-10-CM | POA: Diagnosis not present

## 2019-04-18 DIAGNOSIS — I10 Essential (primary) hypertension: Secondary | ICD-10-CM | POA: Diagnosis not present

## 2019-04-18 DIAGNOSIS — Z885 Allergy status to narcotic agent status: Secondary | ICD-10-CM | POA: Insufficient documentation

## 2019-04-18 DIAGNOSIS — R1084 Generalized abdominal pain: Secondary | ICD-10-CM

## 2019-04-18 DIAGNOSIS — N401 Enlarged prostate with lower urinary tract symptoms: Secondary | ICD-10-CM | POA: Insufficient documentation

## 2019-04-18 DIAGNOSIS — K5900 Constipation, unspecified: Secondary | ICD-10-CM | POA: Diagnosis not present

## 2019-04-18 DIAGNOSIS — K3589 Other acute appendicitis without perforation or gangrene: Secondary | ICD-10-CM

## 2019-04-18 DIAGNOSIS — R338 Other retention of urine: Secondary | ICD-10-CM | POA: Diagnosis not present

## 2019-04-18 DIAGNOSIS — Z9049 Acquired absence of other specified parts of digestive tract: Secondary | ICD-10-CM | POA: Diagnosis not present

## 2019-04-18 DIAGNOSIS — Z96642 Presence of left artificial hip joint: Secondary | ICD-10-CM | POA: Diagnosis not present

## 2019-04-18 DIAGNOSIS — Z886 Allergy status to analgesic agent status: Secondary | ICD-10-CM | POA: Insufficient documentation

## 2019-04-18 HISTORY — PX: LAPAROSCOPIC APPENDECTOMY: SHX408

## 2019-04-18 LAB — COMPREHENSIVE METABOLIC PANEL
ALT: 15 U/L (ref 0–44)
AST: 20 U/L (ref 15–41)
Albumin: 4.5 g/dL (ref 3.5–5.0)
Alkaline Phosphatase: 81 U/L (ref 38–126)
Anion gap: 9 (ref 5–15)
BUN: 17 mg/dL (ref 8–23)
CO2: 24 mmol/L (ref 22–32)
Calcium: 9.3 mg/dL (ref 8.9–10.3)
Chloride: 103 mmol/L (ref 98–111)
Creatinine, Ser: 0.83 mg/dL (ref 0.61–1.24)
GFR calc Af Amer: >60 mL/min (ref >60)
GFR calc non Af Amer: >60 mL/min (ref >60)
Glucose, Bld: 130 mg/dL — ABNORMAL HIGH (ref 70–99)
Potassium: 3.6 mmol/L (ref 3.5–5.1)
Sodium: 136 mmol/L (ref 135–145)
Total Bilirubin: 2.6 mg/dL — ABNORMAL HIGH (ref 0.3–1.2)
Total Protein: 7.3 g/dL (ref 6.5–8.1)

## 2019-04-18 LAB — CBC WITH DIFFERENTIAL/PLATELET
Abs Immature Granulocytes: 0.04 10*3/uL (ref 0.00–0.07)
Basophils Absolute: 0 10*3/uL (ref 0.0–0.1)
Basophils Relative: 0 %
Eosinophils Absolute: 0 10*3/uL (ref 0.0–0.5)
Eosinophils Relative: 0 %
HCT: 41.4 % (ref 39.0–52.0)
Hemoglobin: 15 g/dL (ref 13.0–17.0)
Immature Granulocytes: 0 %
Lymphocytes Relative: 7 %
Lymphs Abs: 0.8 10*3/uL (ref 0.7–4.0)
MCH: 32.9 pg (ref 26.0–34.0)
MCHC: 36.2 g/dL — ABNORMAL HIGH (ref 30.0–36.0)
MCV: 90.8 fL (ref 80.0–100.0)
Monocytes Absolute: 0.4 10*3/uL (ref 0.1–1.0)
Monocytes Relative: 4 %
Neutro Abs: 9.7 10*3/uL — ABNORMAL HIGH (ref 1.7–7.7)
Neutrophils Relative %: 89 %
Platelets: 137 10*3/uL — ABNORMAL LOW (ref 150–400)
RBC: 4.56 MIL/uL (ref 4.22–5.81)
RDW: 12.3 % (ref 11.5–15.5)
WBC: 10.9 10*3/uL — ABNORMAL HIGH (ref 4.0–10.5)
nRBC: 0 % (ref 0.0–0.2)

## 2019-04-18 LAB — MRSA PCR SCREENING: MRSA by PCR: NEGATIVE

## 2019-04-18 LAB — RESPIRATORY PANEL BY RT PCR (FLU A&B, COVID)
Influenza A by PCR: NEGATIVE
Influenza B by PCR: NEGATIVE
SARS Coronavirus 2 by RT PCR: NEGATIVE

## 2019-04-18 SURGERY — APPENDECTOMY, LAPAROSCOPIC
Anesthesia: General | Site: Abdomen

## 2019-04-18 MED ORDER — HYDROMORPHONE HCL 1 MG/ML IJ SOLN
1.0000 mg | Freq: Once | INTRAMUSCULAR | Status: AC
  Administered 2019-04-18: 1 mg via INTRAVENOUS
  Filled 2019-04-18: qty 1

## 2019-04-18 MED ORDER — ONDANSETRON HCL 4 MG/2ML IJ SOLN
INTRAMUSCULAR | Status: DC | PRN
  Administered 2019-04-18: 4 mg via INTRAVENOUS

## 2019-04-18 MED ORDER — FENTANYL CITRATE (PF) 100 MCG/2ML IJ SOLN
INTRAMUSCULAR | Status: AC
  Filled 2019-04-18: qty 2

## 2019-04-18 MED ORDER — LIP MEDEX EX OINT
1.0000 "application " | TOPICAL_OINTMENT | Freq: Two times a day (BID) | CUTANEOUS | Status: DC
  Administered 2019-04-18 – 2019-04-19 (×2): 1 via TOPICAL
  Filled 2019-04-18: qty 7

## 2019-04-18 MED ORDER — BUPIVACAINE HCL 0.25 % IJ SOLN
INTRAMUSCULAR | Status: DC | PRN
  Administered 2019-04-18: 30 mL

## 2019-04-18 MED ORDER — PHENYLEPHRINE 40 MCG/ML (10ML) SYRINGE FOR IV PUSH (FOR BLOOD PRESSURE SUPPORT)
PREFILLED_SYRINGE | INTRAVENOUS | Status: DC | PRN
  Administered 2019-04-18: 80 ug via INTRAVENOUS

## 2019-04-18 MED ORDER — TRAMADOL HCL 50 MG PO TABS: 50.0000 mg | ORAL_TABLET | Freq: Four times a day (QID) | ORAL | Status: DC | PRN

## 2019-04-18 MED ORDER — DEXAMETHASONE SODIUM PHOSPHATE 10 MG/ML IJ SOLN
INTRAMUSCULAR | Status: DC | PRN
  Administered 2019-04-18: 10 mg via INTRAVENOUS

## 2019-04-18 MED ORDER — ALUM & MAG HYDROXIDE-SIMETH 200-200-20 MG/5ML PO SUSP
30.0000 mL | Freq: Four times a day (QID) | ORAL | Status: DC | PRN
  Administered 2019-04-19: 30 mL via ORAL
  Filled 2019-04-18: qty 30

## 2019-04-18 MED ORDER — LOSARTAN POTASSIUM 50 MG PO TABS
100.0000 mg | ORAL_TABLET | Freq: Every evening | ORAL | Status: DC
  Administered 2019-04-18: 20:00:00 100 mg via ORAL
  Filled 2019-04-18: qty 2

## 2019-04-18 MED ORDER — SODIUM CHLORIDE 0.9 % IV BOLUS
500.0000 mL | Freq: Once | INTRAVENOUS | Status: AC
  Administered 2019-04-18: 14:00:00 500 mL via INTRAVENOUS

## 2019-04-18 MED ORDER — VITAMIN D 25 MCG (1000 UNIT) PO TABS
2000.0000 [IU] | ORAL_TABLET | Freq: Every day | ORAL | Status: DC
  Filled 2019-04-18 (×2): qty 2

## 2019-04-18 MED ORDER — METOPROLOL TARTRATE 5 MG/5ML IV SOLN: 5.0000 mg | Freq: Four times a day (QID) | INTRAVENOUS | Status: DC | PRN

## 2019-04-18 MED ORDER — ACETAMINOPHEN 500 MG PO TABS
1000.0000 mg | ORAL_TABLET | ORAL | Status: AC
  Administered 2019-04-18: 1000 mg via ORAL
  Filled 2019-04-18: qty 2

## 2019-04-18 MED ORDER — SUCCINYLCHOLINE CHLORIDE 200 MG/10ML IV SOSY
PREFILLED_SYRINGE | INTRAVENOUS | Status: DC | PRN
  Administered 2019-04-18: 100 mg via INTRAVENOUS

## 2019-04-18 MED ORDER — LIDOCAINE 2% (20 MG/ML) 5 ML SYRINGE
INTRAMUSCULAR | Status: DC | PRN
  Administered 2019-04-18: 50 mg via INTRAVENOUS

## 2019-04-18 MED ORDER — SUGAMMADEX SODIUM 200 MG/2ML IV SOLN
INTRAVENOUS | Status: DC | PRN
  Administered 2019-04-18: 200 mg via INTRAVENOUS

## 2019-04-18 MED ORDER — LACTATED RINGERS IV SOLN: INTRAVENOUS | Status: DC | PRN

## 2019-04-18 MED ORDER — 0.9 % SODIUM CHLORIDE (POUR BTL) OPTIME
TOPICAL | Status: DC | PRN
  Administered 2019-04-18: 17:00:00 1000 mL

## 2019-04-18 MED ORDER — PROCHLORPERAZINE EDISYLATE 10 MG/2ML IJ SOLN: 5.0000 mg | INTRAMUSCULAR | Status: DC | PRN

## 2019-04-18 MED ORDER — PROPOFOL 10 MG/ML IV BOLUS
INTRAVENOUS | Status: DC | PRN
  Administered 2019-04-18: 120 mg via INTRAVENOUS

## 2019-04-18 MED ORDER — GABAPENTIN 300 MG PO CAPS
300.0000 mg | ORAL_CAPSULE | ORAL | Status: AC
  Administered 2019-04-18: 16:00:00 300 mg via ORAL
  Filled 2019-04-18: qty 1

## 2019-04-18 MED ORDER — ONDANSETRON HCL 4 MG/2ML IJ SOLN: 4.0000 mg | Freq: Four times a day (QID) | INTRAMUSCULAR | Status: DC | PRN

## 2019-04-18 MED ORDER — KCL IN DEXTROSE-NACL 20-5-0.45 MEQ/L-%-% IV SOLN
INTRAVENOUS | Status: DC
  Filled 2019-04-18 (×2): qty 1000

## 2019-04-18 MED ORDER — GABAPENTIN 300 MG PO CAPS: 300.0000 mg | ORAL_CAPSULE | Freq: Every day | ORAL | Status: DC

## 2019-04-18 MED ORDER — OXYCODONE HCL 5 MG PO TABS: 5.0000 mg | ORAL_TABLET | Freq: Once | ORAL | Status: DC | PRN

## 2019-04-18 MED ORDER — LACTATED RINGERS IR SOLN
Status: DC | PRN
  Administered 2019-04-18: 1

## 2019-04-18 MED ORDER — FENTANYL CITRATE (PF) 100 MCG/2ML IJ SOLN
INTRAMUSCULAR | Status: DC | PRN
  Administered 2019-04-18: 50 ug via INTRAVENOUS
  Administered 2019-04-18: 75 ug via INTRAVENOUS
  Administered 2019-04-18: 25 ug via INTRAVENOUS
  Administered 2019-04-18: 50 ug via INTRAVENOUS

## 2019-04-18 MED ORDER — EPHEDRINE SULFATE-NACL 50-0.9 MG/10ML-% IV SOSY
PREFILLED_SYRINGE | INTRAVENOUS | Status: DC | PRN
  Administered 2019-04-18: 10 mg via INTRAVENOUS

## 2019-04-18 MED ORDER — ACETAMINOPHEN 500 MG PO TABS
1000.0000 mg | ORAL_TABLET | Freq: Four times a day (QID) | ORAL | Status: DC
  Administered 2019-04-19 (×3): 1000 mg via ORAL
  Filled 2019-04-18 (×3): qty 2

## 2019-04-18 MED ORDER — IOPAMIDOL (ISOVUE-300) INJECTION 61%
100.0000 mL | Freq: Once | INTRAVENOUS | Status: AC | PRN
  Administered 2019-04-18: 100 mL via INTRAVENOUS

## 2019-04-18 MED ORDER — BUPIVACAINE HCL 0.25 % IJ SOLN
INTRAMUSCULAR | Status: AC
  Filled 2019-04-18: qty 1

## 2019-04-18 MED ORDER — PROPOFOL 10 MG/ML IV BOLUS
INTRAVENOUS | Status: AC
  Filled 2019-04-18: qty 20

## 2019-04-18 MED ORDER — ROSUVASTATIN CALCIUM 10 MG PO TABS
10.0000 mg | ORAL_TABLET | Freq: Every day | ORAL | Status: DC
  Administered 2019-04-18: 10 mg via ORAL
  Filled 2019-04-18: qty 1
  Filled 2019-04-18: qty 2

## 2019-04-18 MED ORDER — LACTATED RINGERS IV BOLUS: 1000.0000 mL | Freq: Three times a day (TID) | INTRAVENOUS | Status: DC | PRN

## 2019-04-18 MED ORDER — MAGIC MOUTHWASH
15.0000 mL | Freq: Four times a day (QID) | ORAL | Status: DC | PRN
  Filled 2019-04-18: qty 15

## 2019-04-18 MED ORDER — ROCURONIUM BROMIDE 10 MG/ML (PF) SYRINGE
PREFILLED_SYRINGE | INTRAVENOUS | Status: DC | PRN
  Administered 2019-04-18: 50 mg via INTRAVENOUS

## 2019-04-18 MED ORDER — GABAPENTIN 300 MG PO CAPS
300.0000 mg | ORAL_CAPSULE | Freq: Two times a day (BID) | ORAL | Status: DC
  Administered 2019-04-18: 300 mg via ORAL
  Filled 2019-04-18 (×2): qty 1

## 2019-04-18 MED ORDER — HYDROCORTISONE 1 % EX CREA
1.0000 "application " | TOPICAL_CREAM | Freq: Three times a day (TID) | CUTANEOUS | Status: DC | PRN
  Filled 2019-04-18: qty 28

## 2019-04-18 MED ORDER — HYDROCORTISONE (PERIANAL) 2.5 % EX CREA
1.0000 "application " | TOPICAL_CREAM | Freq: Four times a day (QID) | CUTANEOUS | Status: DC | PRN
  Filled 2019-04-18: qty 28.35

## 2019-04-18 MED ORDER — GUAIFENESIN-DM 100-10 MG/5ML PO SYRP: 10.0000 mL | ORAL_SOLUTION | ORAL | Status: DC | PRN

## 2019-04-18 MED ORDER — PIPERACILLIN-TAZOBACTAM 3.375 G IVPB
3.3750 g | Freq: Three times a day (TID) | INTRAVENOUS | Status: DC
  Administered 2019-04-18: 3.375 g via INTRAVENOUS

## 2019-04-18 MED ORDER — PNEUMOCOCCAL VAC POLYVALENT 25 MCG/0.5ML IJ INJ
0.5000 mL | INJECTION | INTRAMUSCULAR | Status: DC
  Filled 2019-04-18: qty 0.5

## 2019-04-18 MED ORDER — SODIUM CHLORIDE 0.9% FLUSH: 3.0000 mL | INTRAVENOUS | Status: DC | PRN

## 2019-04-18 MED ORDER — LIDOCAINE 2% (20 MG/ML) 5 ML SYRINGE
INTRAMUSCULAR | Status: AC
  Filled 2019-04-18: qty 5

## 2019-04-18 MED ORDER — OXYCODONE HCL 5 MG PO TABS: 5.0000 mg | ORAL_TABLET | ORAL | Status: DC | PRN

## 2019-04-18 MED ORDER — SCOPOLAMINE 1 MG/3DAYS TD PT72
1.0000 | MEDICATED_PATCH | TRANSDERMAL | Status: DC
  Administered 2019-04-18: 15:00:00 1.5 mg via TRANSDERMAL
  Filled 2019-04-18 (×2): qty 1

## 2019-04-18 MED ORDER — SODIUM CHLORIDE 0.9 % IV SOLN: 250.0000 mL | INTRAVENOUS | Status: DC | PRN

## 2019-04-18 MED ORDER — ACETAMINOPHEN 500 MG PO TABS: 1000.0000 mg | ORAL_TABLET | Freq: Three times a day (TID) | ORAL | Status: DC

## 2019-04-18 MED ORDER — DOCUSATE SODIUM 100 MG PO CAPS
100.0000 mg | ORAL_CAPSULE | Freq: Two times a day (BID) | ORAL | Status: DC
  Administered 2019-04-18: 100 mg via ORAL
  Filled 2019-04-18 (×3): qty 1

## 2019-04-18 MED ORDER — AMLODIPINE BESYLATE 5 MG PO TABS
7.5000 mg | ORAL_TABLET | Freq: Every evening | ORAL | Status: DC
  Administered 2019-04-18: 20:00:00 7.5 mg via ORAL
  Filled 2019-04-18 (×2): qty 1

## 2019-04-18 MED ORDER — OXYCODONE HCL 5 MG/5ML PO SOLN: 5.0000 mg | Freq: Once | ORAL | Status: DC | PRN

## 2019-04-18 MED ORDER — ONDANSETRON HCL 4 MG/2ML IJ SOLN: 4.0000 mg | Freq: Once | INTRAMUSCULAR | Status: DC | PRN

## 2019-04-18 MED ORDER — SUCCINYLCHOLINE CHLORIDE 200 MG/10ML IV SOSY
PREFILLED_SYRINGE | INTRAVENOUS | Status: AC
  Filled 2019-04-18: qty 10

## 2019-04-18 MED ORDER — SODIUM CHLORIDE 0.9 % IV SOLN
8.0000 mg | Freq: Four times a day (QID) | INTRAVENOUS | Status: DC | PRN
  Filled 2019-04-18: qty 4

## 2019-04-18 MED ORDER — PIPERACILLIN-TAZOBACTAM 3.375 G IVPB
INTRAVENOUS | Status: AC
  Filled 2019-04-18: qty 50

## 2019-04-18 MED ORDER — MENTHOL 3 MG MT LOZG: 1.0000 | LOZENGE | OROMUCOSAL | Status: DC | PRN

## 2019-04-18 MED ORDER — SODIUM CHLORIDE 0.9% FLUSH
3.0000 mL | Freq: Two times a day (BID) | INTRAVENOUS | Status: DC
  Administered 2019-04-18 – 2019-04-19 (×2): 3 mL via INTRAVENOUS

## 2019-04-18 MED ORDER — FENTANYL CITRATE (PF) 100 MCG/2ML IJ SOLN: 25.0000 ug | INTRAMUSCULAR | Status: DC | PRN

## 2019-04-18 MED ORDER — PHENOL 1.4 % MT LIQD
1.0000 | OROMUCOSAL | Status: DC | PRN
  Filled 2019-04-18: qty 177

## 2019-04-18 MED ORDER — ONDANSETRON HCL 4 MG/2ML IJ SOLN
4.0000 mg | Freq: Once | INTRAMUSCULAR | Status: AC
  Administered 2019-04-18: 4 mg via INTRAVENOUS
  Filled 2019-04-18: qty 2

## 2019-04-18 MED ORDER — ENALAPRILAT 1.25 MG/ML IV SOLN
0.6250 mg | Freq: Four times a day (QID) | INTRAVENOUS | Status: DC | PRN
  Filled 2019-04-18: qty 1

## 2019-04-18 MED ORDER — EPHEDRINE 5 MG/ML INJ
INTRAVENOUS | Status: AC
  Filled 2019-04-18: qty 10

## 2019-04-18 MED ORDER — ENOXAPARIN SODIUM 40 MG/0.4ML ~~LOC~~ SOLN
40.0000 mg | SUBCUTANEOUS | Status: DC
  Administered 2019-04-19: 40 mg via SUBCUTANEOUS
  Filled 2019-04-18: qty 0.4

## 2019-04-18 MED ORDER — MORPHINE SULFATE (PF) 2 MG/ML IV SOLN: 1.0000 mg | INTRAVENOUS | Status: DC | PRN

## 2019-04-18 MED ORDER — POLYETHYLENE GLYCOL 3350 17 G PO PACK: 17.0000 g | PACK | Freq: Every day | ORAL | Status: DC | PRN

## 2019-04-18 SURGICAL SUPPLY — 46 items
APPLIER CLIP 5 13 M/L LIGAMAX5 (MISCELLANEOUS)
APPLIER CLIP ROT 10 11.4 M/L (STAPLE)
CABLE HIGH FREQUENCY MONO STRZ (ELECTRODE) ×2 IMPLANT
CHLORAPREP W/TINT 26 (MISCELLANEOUS) ×2 IMPLANT
CLIP APPLIE 5 13 M/L LIGAMAX5 (MISCELLANEOUS) IMPLANT
CLIP APPLIE ROT 10 11.4 M/L (STAPLE) IMPLANT
COVER SURGICAL LIGHT HANDLE (MISCELLANEOUS) ×2 IMPLANT
COVER WAND RF STERILE (DRAPES) IMPLANT
CUTTER FLEX LINEAR 45M (STAPLE) IMPLANT
DECANTER SPIKE VIAL GLASS SM (MISCELLANEOUS) ×2 IMPLANT
DEVICE TROCAR PUNCTURE CLOSURE (ENDOMECHANICALS) IMPLANT
DRAPE LAPAROSCOPIC ABDOMINAL (DRAPES) ×2 IMPLANT
DRAPE WARM FLUID 44X44 (DRAPES) ×2 IMPLANT
DRSG TEGADERM 2-3/8X2-3/4 SM (GAUZE/BANDAGES/DRESSINGS) ×4 IMPLANT
DRSG TEGADERM 4X4.75 (GAUZE/BANDAGES/DRESSINGS) ×2 IMPLANT
ELECT REM PT RETURN 15FT ADLT (MISCELLANEOUS) ×2 IMPLANT
ENDOLOOP SUT PDS II  0 18 (SUTURE)
ENDOLOOP SUT PDS II 0 18 (SUTURE) IMPLANT
GAUZE SPONGE 2X2 8PLY STRL LF (GAUZE/BANDAGES/DRESSINGS) ×1 IMPLANT
GLOVE ECLIPSE 8.0 STRL XLNG CF (GLOVE) ×2 IMPLANT
GLOVE INDICATOR 8.0 STRL GRN (GLOVE) ×2 IMPLANT
GOWN STRL REUS W/TWL XL LVL3 (GOWN DISPOSABLE) ×4 IMPLANT
IRRIG SUCT STRYKERFLOW 2 WTIP (MISCELLANEOUS) ×2
IRRIGATION SUCT STRKRFLW 2 WTP (MISCELLANEOUS) ×1 IMPLANT
KIT BASIN OR (CUSTOM PROCEDURE TRAY) ×2 IMPLANT
KIT TURNOVER KIT A (KITS) IMPLANT
PAD POSITIONING PINK XL (MISCELLANEOUS) ×2 IMPLANT
PENCIL SMOKE EVACUATOR (MISCELLANEOUS) IMPLANT
POUCH RETRIEVAL ECOSAC 10 (ENDOMECHANICALS) ×1 IMPLANT
POUCH RETRIEVAL ECOSAC 10MM (ENDOMECHANICALS) ×1
RELOAD 45 VASCULAR/THIN (ENDOMECHANICALS) IMPLANT
RELOAD STAPLE TA45 3.5 REG BLU (ENDOMECHANICALS) IMPLANT
SCISSORS LAP 5X35 DISP (ENDOMECHANICALS) ×2 IMPLANT
SET TUBE SMOKE EVAC HIGH FLOW (TUBING) ×2 IMPLANT
SHEARS HARMONIC ACE PLUS 36CM (ENDOMECHANICALS) IMPLANT
SLEEVE XCEL OPT CAN 5 100 (ENDOMECHANICALS) ×4 IMPLANT
SPONGE GAUZE 2X2 STER 10/PKG (GAUZE/BANDAGES/DRESSINGS) ×1
SUT MNCRL AB 4-0 PS2 18 (SUTURE) ×2 IMPLANT
SUT PDS AB 0 CT1 36 (SUTURE) IMPLANT
SUT PDS AB 1 CT1 27 (SUTURE) IMPLANT
SUT SILK 2 0 SH (SUTURE) IMPLANT
TOWEL OR 17X26 10 PK STRL BLUE (TOWEL DISPOSABLE) ×2 IMPLANT
TRAY FOLEY MTR SLVR 16FR STAT (SET/KITS/TRAYS/PACK) IMPLANT
TRAY LAPAROSCOPIC (CUSTOM PROCEDURE TRAY) ×2 IMPLANT
TROCAR BLADELESS OPT 5 100 (ENDOMECHANICALS) ×2 IMPLANT
TROCAR XCEL 12X100 BLDLESS (ENDOMECHANICALS) ×2 IMPLANT

## 2019-04-18 NOTE — Anesthesia Procedure Notes (Signed)
Procedure Name: Intubation Date/Time: 04/18/2019 4:42 PM Performed by: Niel Hummer, CRNA Pre-anesthesia Checklist: Patient identified, Emergency Drugs available, Suction available and Patient being monitored Patient Re-evaluated:Patient Re-evaluated prior to induction Oxygen Delivery Method: Circle system utilized Preoxygenation: Pre-oxygenation with 100% oxygen Induction Type: IV induction and Rapid sequence Laryngoscope Size: Mac and 4 Grade View: Grade II Tube type: Oral Tube size: 7.5 mm Number of attempts: 1 Airway Equipment and Method: Stylet Placement Confirmation: ETT inserted through vocal cords under direct vision,  positive ETCO2 and breath sounds checked- equal and bilateral Secured at: 23 cm Tube secured with: Tape Dental Injury: Teeth and Oropharynx as per pre-operative assessment  Comments: Grade 2b view.

## 2019-04-18 NOTE — H&P (Addendum)
[facility] Admission Note  Jesus Wagner March 04, 1936  [id number].    Requesting MD: Estell Harpin Chief Complaint/Reason for Consult: acute appendicitis HPI:  Patient is a 84 year old male with PMH significant for HTN and BPH who presented to [facility] after outpatient CT that showed acute appendicitis. CT was ordered by patient's gastroenterologist (Dr. Jeani Hawking). Patient reports 1 week history of mild abdominal pain that started in middle of abdomen and has now localized more to RLQ. Patient reports he thought he may have just been constipated and tried taking linzess at home. He has struggled with constipation/diarrhea issues since having his gallbladder removed. He had a very large BM this AM but pain persisted. Also reported associated nausea and vomiting. Reports some chills today but no fever. He has noted some increased urinary retention as well, but was able to urinate this AM. Denies chest pain, SOB, or sick contacts. Past abdominal surgeries include laparoscopic cholecystectomy in April 2020 and left inguinal hernia repair in the 1970s. He does not take any blood thinning medications. Allergic to aspirin and has nausea with codeine. 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   ROS: Review of Systems  Constitutional: Positive for chills and malaise/fatigue. Negative for fever.  Respiratory: Negative for shortness of breath and wheezing.   Cardiovascular: Negative for chest pain and palpitations.  Gastrointestinal: Positive for abdominal pain, constipation, diarrhea, nausea and vomiting. Negative for blood in stool and melena.  Genitourinary: Positive for urgency. Negative for dysuria and frequency.  Skin: Negative for rash.  All other systems reviewed and are negative.   History reviewed. No pertinent family history.  Past Medical History:  Diagnosis Date  . Hypertension   . Prostate enlargement     Past Surgical  History:  Procedure Laterality Date  . CHOLECYSTECTOMY N/A 07/09/2018   Procedure: LAPAROSCOPIC CHOLECYSTECTOMY;  Surgeon: Emelia Loron, MD;  Location: [facility];  Service: General;  Laterality: N/A;  . COLONOSCOPY     [id number]  . HERNIA REPAIR     inguinal  . laser varicose vein    . TOTAL HIP ARTHROPLASTY Left 08/05/2013   Procedure: LEFT TOTAL HIP ARTHROPLASTY ANTERIOR APPROACH;  Surgeon: Kathryne Hitch, MD;  Location: [facility];  Service: Orthopedics;  Laterality: Left;  Marland Kitchen VASECTOMY      Social History:  reports that he has never smoked. He has never used smokeless tobacco. He reports that he does not drink alcohol or use drugs.  Allergies:  Allergies  Allergen Reactions  . Aspirin     Stomach upset   . Codeine Nausea Only    (Not in a hospital admission)   Blood pressure (!) 170/73, pulse 71, temperature 98.2 F (36.8 C), temperature source Oral, resp. rate 13, SpO2 100 %. Physical Exam: Physical Exam Constitutional:      General: He is not in acute distress.    Appearance: He is well-developed and normal weight. He is not toxic-appearing.  HENT:     Right Ear: External ear normal.     Left Ear: External ear normal.     Nose: Nose normal.     Mouth/Throat:     Lips: Pink.     Mouth: Mucous membranes are moist.  Eyes:     General: Lids are normal. No scleral icterus.    Extraocular Movements: Extraocular movements intact.     Conjunctiva/sclera: Conjunctivae normal.  Cardiovascular:     Rate and Rhythm: Normal  rate and regular rhythm.     Pulses:          Radial pulses are 2+ on the right side and 2+ on the left side.       Dorsalis pedis pulses are 2+ on the right side and 2+ on the left side.     Heart sounds: Normal heart sounds.  Pulmonary:     Effort: Pulmonary effort is normal.     Breath sounds: Normal breath sounds.  Abdominal:     General: Bowel sounds are normal. There is no distension.     Palpations: Abdomen is soft. There is no hepatomegaly  or splenomegaly.     Tenderness: There is abdominal tenderness in the right lower quadrant and suprapubic area. There is no guarding or rebound. Positive signs include McBurney's sign.     Hernia: No hernia is present.  Musculoskeletal:     Cervical back: Normal range of motion and neck supple.     Right lower leg: No edema.     Left lower leg: No edema.     Comments: ROM grossly intact in bilateral upper and lower extremities, no obvious deformity of bilateral upper and lower extremities  Skin:    General: Skin is warm and dry.     Findings: No rash.  Neurological:     Mental Status: He is alert and oriented to person, place, and time.  Psychiatric:        Attention and Perception: Attention normal.        Mood and Affect: Mood and affect normal.        Speech: Speech normal.        Behavior: Behavior normal. Behavior is cooperative.        Judgment: Judgment normal.     Results for orders placed or performed during the hospital encounter of 04/18/19 (from the past 48 hour(s))  CBC with Differential/Platelet     Status: Abnormal   Collection Time: 04/18/19  1:32 PM  Result Value Ref Range   WBC 10.9 (H) 4.0 - 10.5 K/uL   RBC 4.56 4.22 - 5.81 MIL/uL   Hemoglobin 15.0 13.0 - 17.0 g/dL   HCT 27.2 53.6 - 64.4 %   MCV 90.8 80.0 - 100.0 fL   MCH 32.9 26.0 - 34.0 pg   MCHC 36.2 (H) 30.0 - 36.0 g/dL   RDW 03.4 74.2 - 59.5 %   Platelets 137 (L) 150 - 400 K/uL   nRBC 0.0 0.0 - 0.2 %   Neutrophils Relative % 89 %   Neutro Abs 9.7 (H) 1.7 - 7.7 K/uL   Lymphocytes Relative 7 %   Lymphs Abs 0.8 0.7 - 4.0 K/uL   Monocytes Relative 4 %   Monocytes Absolute 0.4 0.1 - 1.0 K/uL   Eosinophils Relative 0 %   Eosinophils Absolute 0.0 0.0 - 0.5 K/uL   Basophils Relative 0 %   Basophils Absolute 0.0 0.0 - 0.1 K/uL   Immature Granulocytes 0 %   Abs Immature Granulocytes 0.04 0.00 - 0.07 K/uL    Comment: Performed at [facility], [address], [place], [place] [address]   Comprehensive metabolic panel     Status: Abnormal   Collection Time: 04/18/19  1:32 PM  Result Value Ref Range   Sodium 136 135 - 145 mmol/L   Potassium 3.6 3.5 - 5.1 mmol/L   Chloride 103 98 - 111 mmol/L   CO2 24 22 - 32 mmol/L   Glucose, Bld 130 (  H) 70 - 99 mg/dL   BUN 17 8 - 23 mg/dL   Creatinine, Ser 0.83 0.61 - 1.24 mg/dL   Calcium 9.3 8.9 - 10.3 mg/dL   Total Protein 7.3 6.5 - 8.1 g/dL   Albumin 4.5 3.5 - 5.0 g/dL   AST 20 15 - 41 U/L   ALT 15 0 - 44 U/L   Alkaline Phosphatase 81 38 - 126 U/L   Total Bilirubin 2.6 (H) 0.3 - 1.2 mg/dL   GFR calc non Af Amer >60 >60 mL/min   GFR calc Af Amer >60 >60 mL/min   Anion gap 9 5 - 15    Comment: Performed at [facility], [place] [address] [place], [place] [address]  Respiratory Panel by RT PCR (Flu A&B, Covid) - Nasopharyngeal Swab     Status: None   Collection Time: 04/18/19  1:32 PM   Specimen: Nasopharyngeal Swab  Result Value Ref Range   SARS Coronavirus 2 by RT PCR NEGATIVE NEGATIVE    Comment: (NOTE) SARS-CoV-2 target nucleic acids are NOT DETECTED. The SARS-CoV-2 RNA is generally detectable in upper respiratoy specimens during the acute phase of infection. The lowest concentration of SARS-CoV-2 viral copies this assay can detect is 131 copies/mL. A negative result does not preclude SARS-Cov-2 infection and should not be used as the sole basis for treatment or other patient management decisions. A negative result may occur with  improper specimen collection/handling, submission of specimen other than nasopharyngeal swab, presence of viral mutation(s) within the areas targeted by this assay, and inadequate number of viral copies (<131 copies/mL). A negative result must be combined with clinical observations, patient history, and epidemiological information. The expected result is Negative. Fact Sheet for Patients:  PinkCheek.be Fact Sheet for Healthcare Providers:   GravelBags.it This test is not yet ap proved or cleared by the [place] FDA and  has been authorized for detection and/or diagnosis of SARS-CoV-2 by FDA under an Emergency Use Authorization (EUA). This EUA will remain  in effect (meaning this test can be used) for the duration of the COVID-19 declaration under Section 564(b)(1) of the Act, 21 U.S.C. section 360bbb-3(b)(1), unless the authorization is terminated or revoked sooner.    Influenza A by PCR NEGATIVE NEGATIVE   Influenza B by PCR NEGATIVE NEGATIVE    Comment: (NOTE) The Xpert Xpress SARS-CoV-2/FLU/RSV assay is intended as an aid in  the diagnosis of influenza from Nasopharyngeal swab specimens and  should not be used as a sole basis for treatment. Nasal washings and  aspirates are unacceptable for Xpert Xpress SARS-CoV-2/FLU/RSV  testing. Fact Sheet for Patients: PinkCheek.be Fact Sheet for Healthcare Providers: GravelBags.it This test is not yet approved or cleared by the [place] FDA and  has been authorized for detection and/or diagnosis of SARS-CoV-2 by  FDA under an Emergency Use Authorization (EUA). This EUA will remain  in effect (meaning this test can be used) for the duration of the  Covid-19 declaration under Section 564(b)(1) of the Act, 21  U.S.C. section 360bbb-3(b)(1), unless the authorization is  terminated or revoked. Performed at [facility], [place] [address] [place], [place] [address]    CT ABDOMEN PELVIS W CONTRAST  Result Date: 04/18/2019 CLINICAL DATA:  Nausea and vomiting and constipation. EXAM: CT ABDOMEN AND PELVIS WITH CONTRAST TECHNIQUE: Multidetector CT imaging of the abdomen and pelvis was performed using the standard protocol following bolus administration of intravenous contrast. CONTRAST:  135mL ISOVUE-300 IOPAMIDOL (ISOVUE-300) INJECTION 61% COMPARISON:  07/09/2018 FINDINGS:  Lower chest: The lung bases are clear of an acute process. Stable mild right lower lobe bronchiectasis and stable cluster of calcifications in the left lower lobe. No infiltrates or effusions. The heart is normal in size. No pericardial effusion. Small hiatal hernia noted. Moderate distal aortic calcifications. Hepatobiliary: A few tiny scattered cysts are noted. Small calcified granuloma at the left hepatic dome. The gallbladder is surgically absent. No intra or extrahepatic biliary dilatation. The portal and hepatic veins are patent. Pancreas: No mass, inflammation or ductal dilatation. Spleen: Stable calcified granulomas. Adrenals/Urinary Tract: The adrenal glands and kidneys are unremarkable. A few tiny renal cysts are noted. No worrisome renal lesions or hydroureteronephrosis. The bladder is unremarkable. Stomach/Bowel: The stomach, duodenum, small bowel and colon are unremarkable. No acute inflammatory changes, mass lesions or obstructive findings. The terminal ileum is normal. The appendix is markedly distended and fluid-filled and contains 2 appendicoliths, the largest measuring 8 mm. This is at the appendiceal orifice and is likely obstructing the appendix. Mild surrounding inflammatory changes. No findings for perforation but the appendix is dilated up to 19.5 mm. Vascular/Lymphatic: Moderate atherosclerotic calcifications involving the aorta and iliac arteries but no aneurysm or dissection. The branch vessels are patent. The major venous structures are patent. No mesenteric or retroperitoneal mass or adenopathy. Reproductive: Mild prostate gland enlargement. Suspect prior TURP. The seminal vesicles appear normal. Other: No pelvic mass or adenopathy. No free pelvic fluid collections. No inguinal mass or adenopathy. No abdominal wall hernia or subcutaneous lesions. Musculoskeletal: No significant bony findings. Total left hip arthroplasty noted. IMPRESSION: CT findings consistent with acute appendicitis.  Significant distension of the appendix which contains 2 appendicoliths. No findings for rupture. These results will be called to the ordering clinician or representative by the Radiologist Assistant, and communication documented in the PACS or zVision Dashboard. Electronically Signed   By: Rudie Meyer M.D.   On: 04/18/2019 12:12      Assessment/Plan HTN BPH  Acute appendicitis - CT today with dilated appendix and appendicoliths. No signs of perforation or abscess - WBC mildly elevated at 10, patient is afebrile - clinical picture consistent with acute appendicitis - recommend laparoscopic appendectomy  Admit to observation post-operatively. To OR for laparoscopic appendectomy.   Total time spent: 30 minutes  Wells Guiles, [facility] 04/18/2019, 2:32 PM Please see Amion for pager number during day hours 7:00am-4:30pm

## 2019-04-18 NOTE — Discharge Instructions (Signed)
[facility] LAPAROSCOPIC SURGERY: POST OP INSTRUCTIONS Always review your discharge instruction sheet given to you by the facility where your surgery was performed. IF YOU HAVE DISABILITY OR FAMILY LEAVE FORMS, YOU MUST BRING THEM TO THE OFFICE FOR PROCESSING.   DO NOT GIVE THEM TO YOUR DOCTOR.  PAIN CONTROL  1. First take acetaminophen (Tylenol) AND/or ibuprofen (Advil) to control your pain after surgery.  Follow directions on package.  Taking acetaminophen (Tylenol) and/or ibuprofen (Advil) regularly after surgery will help to control your pain and lower the amount of prescription pain medication you may need.  You should not take more than 3,000 mg (3 grams) of acetaminophen (Tylenol) in 24 hours.  You should not take ibuprofen (Advil), aleve, motrin, naprosyn or other NSAIDS if you have a history of stomach ulcers or chronic kidney disease.  2. A prescription for pain medication may be given to you upon discharge.  Take your pain medication as prescribed, if you still have uncontrolled pain after taking acetaminophen (Tylenol) or ibuprofen (Advil). 3. Use ice packs to help control pain. 4. If you need a refill on your pain medication, please contact your pharmacy.  They will contact our office to request authorization. Prescriptions will not be filled after 5pm or on week-ends.  HOME MEDICATIONS 5. Take your usually prescribed medications unless otherwise directed.  DIET 6. You should follow a light diet the first few days after arrival home.  Be sure to include lots of fluids daily. Avoid fatty, fried foods.   CONSTIPATION 7. It is common to experience some constipation after surgery and if you are taking pain medication.  Increasing fluid intake and taking a stool softener (such as Colace) will usually help or prevent this problem from occurring.  A mild laxative (Milk of Magnesia or Miralax) should be taken according to package instructions if there are no bowel  movements after 48 hours.  WOUND/INCISION CARE 8. Most patients will experience some swelling and bruising in the area of the incisions.  Ice packs will help.  Swelling and bruising can take several days to resolve.  9. Unless discharge instructions indicate otherwise, follow guidelines below  a. STERI-STRIPS - you may remove your outer bandages 48 hours after surgery, and you may shower at that time.  You have steri-strips (small skin tapes) in place directly over the incision.  These strips should be left on the skin for 7-10 days.   b. DERMABOND/SKIN GLUE - you may shower in 24 hours.  The glue will flake off over the next 2-3 weeks. 10. Any sutures or staples will be removed at the office during your follow-up visit.  ACTIVITIES 11. You may resume regular (light) daily activities beginning the next day--such as daily self-care, walking, climbing stairs--gradually increasing activities as tolerated.  You may have sexual intercourse when it is comfortable.  Refrain from any heavy lifting or straining until approved by your doctor. a. You may drive when you are no longer taking prescription pain medication, you can comfortably wear a seatbelt, and you can safely maneuver your car and apply brakes.  FOLLOW-UP 12. You should see your doctor in the office for a follow-up appointment approximately 2-3 weeks after your surgery.  You should have been given your post-op/follow-up appointment when your surgery was scheduled.  If you did not receive a post-op/follow-up appointment, make sure that you call for this appointment within a day or two after you arrive home to insure a convenient appointment time.     WHEN TO CALL YOUR DOCTOR: 1. Fever over 101.0 2. Inability to urinate 3. Continued bleeding from incision. 4. Increased pain, redness, or drainage from the incision. 5. Increasing abdominal pain  The clinic staff is available to answer your questions during regular business hours.  Please don't  hesitate to call and ask to speak to one of the nurses for clinical concerns.  If you have a medical emergency, go to the nearest emergency room or call 911.  A surgeon from [facility] is always on call at the hospital. [address], [address], [place], [place]  [address] ? [address] [address], [place], [place]   [address] [phone] ? [phone] ? FAX [phone] Web site: [email or web address]  .........   Managing Your Pain After Surgery Without Opioids    Thank you for participating in our program to help patients manage their pain after surgery without opioids. This is part of our effort to provide you with the best care possible, without exposing you or your family to the risk that opioids pose.  What pain can I expect after surgery? You can expect to have some pain after surgery. This is normal. The pain is typically worse the day after surgery, and quickly begins to get better. Many studies have found that many patients are able to manage their pain after surgery with Over-the-Counter (OTC) medications such as Tylenol and Motrin. If you have a condition that does not allow you to take Tylenol or Motrin, notify your surgical team.  How will I manage my pain? The best strategy for controlling your pain after surgery is around the clock pain control with Tylenol (acetaminophen) and Motrin (ibuprofen or Advil). Alternating these medications with each other allows you to maximize your pain control. In addition to Tylenol and Motrin, you can use heating pads or ice packs on your incisions to help reduce your pain.  How will I alternate your regular strength over-the-counter pain medication? You will take a dose of pain medication every three hours. ; Start by taking 650 mg of Tylenol (2 pills of 325 mg) ; 3 hours later take 600 mg of Motrin (3 pills of 200 mg) ; 3 hours after taking the Motrin take 650 mg of Tylenol ; 3 hours after that take 600 mg of  Motrin.   - 1 -  See example - if your first dose of Tylenol is at 12:00 PM   12:00 PM Tylenol 650 mg (2 pills of 325 mg)  3:00 PM Motrin 600 mg (3 pills of 200 mg)  6:00 PM Tylenol 650 mg (2 pills of 325 mg)  9:00 PM Motrin 600 mg (3 pills of 200 mg)  Continue alternating every 3 hours   We recommend that you follow this schedule around-the-clock for at least 3 days after surgery, or until you feel that it is no longer needed. Use the table on the last page of this handout to keep track of the medications you are taking. Important: Do not take more than 3000mg of Tylenol or 3200mg of Motrin in a 24-hour period. Do not take ibuprofen/Motrin if you have a history of bleeding stomach ulcers, severe kidney disease, &/or actively taking a blood thinner  What if I still have pain? If you have pain that is not controlled with the over-the-counter pain medications (Tylenol and Motrin or Advil) you might have what we call "breakthrough" pain. You will receive a prescription for a small amount of an opioid pain medication such as   Oxycodone, Tramadol, or Tylenol with Codeine. Use these opioid pills in the first 24 hours after surgery if you have breakthrough pain. Do not take more than 1 pill every 4-6 hours.  If you still have uncontrolled pain after using all opioid pills, don't hesitate to call our staff using the number provided. We will help make sure you are managing your pain in the best way possible, and if necessary, we can provide a prescription for additional pain medication.   Day 1    Time  Name of Medication Number of pills taken  Amount of Acetaminophen  Pain Level   Comments  AM PM       AM PM       AM PM       AM PM       AM PM       AM PM       AM PM       AM PM       Total Daily amount of Acetaminophen Do not take more than  3,000 mg per day      Day 2    Time  Name of Medication Number of pills taken  Amount of Acetaminophen  Pain Level   Comments  AM  PM       AM PM       AM PM       AM PM       AM PM       AM PM       AM PM       AM PM       Total Daily amount of Acetaminophen Do not take more than  3,000 mg per day      Day 3    Time  Name of Medication Number of pills taken  Amount of Acetaminophen  Pain Level   Comments  AM PM       AM PM       AM PM       AM PM          AM PM       AM PM       AM PM       AM PM       Total Daily amount of Acetaminophen Do not take more than  3,000 mg per day      Day 4    Time  Name of Medication Number of pills taken  Amount of Acetaminophen  Pain Level   Comments  AM PM       AM PM       AM PM       AM PM       AM PM       AM PM       AM PM       AM PM       Total Daily amount of Acetaminophen Do not take more than  3,000 mg per day      Day 5    Time  Name of Medication Number of pills taken  Amount of Acetaminophen  Pain Level   Comments  AM PM       AM PM       AM PM       AM PM       AM PM       AM PM       AM PM         AM PM       Total Daily amount of Acetaminophen Do not take more than  3,000 mg per day       Day 6    Time  Name of Medication Number of pills taken  Amount of Acetaminophen  Pain Level  Comments  AM PM       AM PM       AM PM       AM PM       AM PM       AM PM       AM PM       AM PM       Total Daily amount of Acetaminophen Do not take more than  3,000 mg per day      Day 7    Time  Name of Medication Number of pills taken  Amount of Acetaminophen  Pain Level   Comments  AM PM       AM PM       AM PM       AM PM       AM PM       AM PM       AM PM       AM PM       Total Daily amount of Acetaminophen Do not take more than  3,000 mg per day        For additional information about how and where to safely dispose of unused opioid medications - https://www.morepowerfulnc.org  Disclaimer: This document contains information and/or instructional materials adapted from [facility]  for the typical patient with your condition. It does not replace medical advice from your health care provider because your experience may differ from that of the typical patient. Talk to your health care provider if you have any questions about this document, your condition or your treatment plan. Adapted from [facility]  

## 2019-04-18 NOTE — Plan of Care (Signed)

## 2019-04-18 NOTE — ED Triage Notes (Signed)
Patient is from home and presents with lower right abdominal pain for about a day and a half. Abdomin is also tender with palpation. MD told the patient to come to the hospital post imaging due to kidney stones and acute appendicitis. Patient also complains of nausea and vomiting. EMS administered 4 mg of Zofran. BP 168/90

## 2019-04-18 NOTE — Op Note (Signed)
04/18/2019  PATIENT:  Jesus Wagner  84 y.o. male  Patient Care Team: Shon Baton, MD as PCP - General (Internal Medicine)  PRE-OPERATIVE DIAGNOSIS:  ACUTE APPENDICITIS  POST-OPERATIVE DIAGNOSIS:  ACUTE APPENDICITIS  PROCEDURE: LAPAROSCOPIC APPENDECTOMY PERITONEAL BIOPSY   SURGEON:  Adin Hector, MD  ANESTHESIA:   local and general  EBL:  Total I/O In: 1829 [I.V.:1000; IV Piggyback:50] Out: 440 [Urine:425; Blood:15]  Delay start of Pharmacological VTE agent (>24hrs) due to surgical blood loss or risk of bleeding:  no  DRAINS: none   SPECIMEN:   APPENDIX  DISPOSITION OF SPECIMEN:  PATHOLOGY  COUNTS:  YES  PLAN OF CARE: Admit for overnight observation  PATIENT DISPOSITION:  PACU - hemodynamically stable.   INDICATIONS: Patient with concerning symptoms & work up suspicious for appendicitis.  Surgery was recommended:  The anatomy & physiology of the digestive tract was discussed.  The pathophysiology of appendicitis was discussed.  Natural history risks without surgery was discussed.   I feel the risks of no intervention will lead to serious problems that outweigh the operative risks; therefore, I recommended diagnostic laparoscopy with removal of appendix to remove the pathology.  Laparoscopic & open techniques were discussed.   I noted a good likelihood this will help address the problem.    Risks such as bleeding, infection, abscess, leak, reoperation, possible ostomy, hernia, heart attack, death, and other risks were discussed.  Goals of post-operative recovery were discussed as well.  We will work to minimize complications.  Questions were answered.  The patient expresses understanding & wishes to proceed with surgery.  OR FINDINGS: Bulky retrocolic appendix going underneath ascending colon with inflammation consistent with acute appendicitis. Some early phlegmon but no frank gangrene.  Brownish small nodules on the right paracolic gutter and cul-de-sac. Removed for  biopsy.? Retained stone  DESCRIPTION:   Informed consent was confirmed.  The patient underwent general anaesthesia without difficulty.  The patient was positioned appropriately.  VTE prevention in place.  The patient's abdomen was clipped, prepped, & draped in a sterile fashion.  Surgical timeout confirmed our plan.  Peritoneal entry with a laparoscopic port was obtained using optical entry technique in the left upper abdomen as the patient was positioned in reverse Trendelenburg.  Entry was clean.  I induced carbon dioxide insufflation.  Camera inspection revealed no injury.  Extra ports were carefully placed under direct laparoscopic visualization.  I mobilized the terminal ileum to proximal ascending colon in a lateral to medial fashion.  I took care to avoid injuring any retroperitoneal structures.   I freed the appendix off its attachments to the ascending colon and cecal mesentery.  I elevated the appendix. I skeletonized and transected through the elongated mesoappendix. I was able to free off the base of the appendix which was still viable.  I stapled the appendix off the cecum using a laparoscopic stapler. I took a healthy cuff of viable cecum.  I placed the appendix inside an EcoSac bag and removed out the 12 mm port.  I did copious irrigation. Hemostasis was good in the mesoappendix, colon mesentery, and retroperitoneum. I did confirm some dark brown nodules about 5 mm in size. 1 in the pericolic gutter into in the cul-de-sac I decided to remove those using blunt and harmonic dissection. Sent that off separately. I ran the small bowel from the ileocecal valve more proximally. No Meckel's diverticulum. No bowel injury staple line was intact on the cecum with no bleeding. I washed out the pelvis, retrohepatic space  and right paracolic gutter. I washed out the left side as well.  Hemostasis is good. There was no perforation or injury. Because the area cleaned up well after irrigation, I did not  place a drain.  I aspirated the carbon dioxide. I removed the ports.   I closed the 12 mm stapler port site with a 0 Vicryl stitch.  I closed skin using 4-0 monocryl stitch.  Sterile dressings applied.  Patient was extubated and sent to the recovery room.  I suspect the patient is going used in the hospital at least overnight and will need antibiotics for overnight only. I discussed operative findings, updated the patient's status, discussed probable steps to recovery, and gave postoperative recommendations to the patient's spouse.  Recommendations were made.  Questions were answered.  She expressed understanding & appreciation.   Ardeth Sportsman, M.D., F.A.C.S. Gastrointestinal and Minimally Invasive Surgery [facility] [address], [address] [place], [place] [address] [phone] Main / Paging  04/18/2019 5:50 PM

## 2019-04-18 NOTE — Transfer of Care (Signed)
Immediate Anesthesia Transfer of Care Note  Patient: Jesus Wagner  Procedure(s) Performed: Procedure(s): APPENDECTOMY LAPAROSCOPIC (N/A)  Patient Location: PACU  Anesthesia Type:General  Level of Consciousness: Alert, Awake, Oriented  Airway & Oxygen Therapy: Patient Spontanous Breathing  Post-op Assessment: Report given to RN  Post vital signs: Reviewed and stable  Last Vitals:  Vitals:   04/18/19 1445 04/18/19 1500  BP:  (!) 150/72  Pulse: 73 70  Resp:    Temp:    SpO2: 98% 100%    Complications: No apparent anesthesia complications

## 2019-04-18 NOTE — Anesthesia Preprocedure Evaluation (Addendum)
Anesthesia Evaluation  Patient identified by MRN, date of birth, ID band Patient awake    Reviewed: Allergy & Precautions, NPO status , Patient's Chart, lab work & pertinent test results  History of Anesthesia Complications Negative for: history of anesthetic complications  Airway Mallampati: II  TM Distance: <3 FB Neck ROM: Full    Dental  (+) Teeth Intact   Pulmonary neg pulmonary ROS,    Pulmonary exam normal        Cardiovascular hypertension, Normal cardiovascular exam     Neuro/Psych negative neurological ROS  negative psych ROS   GI/Hepatic negative GI ROS, Neg liver ROS,   Endo/Other  negative endocrine ROS  Renal/GU negative Renal ROS  negative genitourinary   Musculoskeletal negative musculoskeletal ROS (+)   Abdominal   Peds  Hematology negative hematology ROS (+)   Anesthesia Other Findings Acute appendicitis  Reproductive/Obstetrics                            Anesthesia Physical Anesthesia Plan  ASA: III  Anesthesia Plan: General   Post-op Pain Management:    Induction: Intravenous and Rapid sequence  PONV Risk Score and Plan: 2 and Ondansetron, Dexamethasone, Treatment may vary due to age or medical condition and Midazolam  Airway Management Planned: Oral ETT  Additional Equipment: None  Intra-op Plan:   Post-operative Plan: Extubation in OR  Informed Consent: I have reviewed the patients History and Physical, chart, labs and discussed the procedure including the risks, benefits and alternatives for the proposed anesthesia with the patient or authorized representative who has indicated his/her understanding and acceptance.     Dental advisory given  Plan Discussed with:   Anesthesia Plan Comments:        Anesthesia Quick Evaluation

## 2019-04-18 NOTE — Anesthesia Postprocedure Evaluation (Signed)
Anesthesia Post Note  Patient: Mead Slane  Procedure(s) Performed: APPENDECTOMY LAPAROSCOPIC (N/A Abdomen)     Patient location during evaluation: PACU Anesthesia Type: General Level of consciousness: awake and alert Pain management: pain level controlled Vital Signs Assessment: post-procedure vital signs reviewed and stable Respiratory status: spontaneous breathing, nonlabored ventilation and respiratory function stable Cardiovascular status: blood pressure returned to baseline and stable Postop Assessment: no apparent nausea or vomiting Anesthetic complications: no    Last Vitals:  Vitals:   04/18/19 1800 04/18/19 1815  BP: (!) 137/57 (!) 149/67  Pulse: 88 95  Resp: 12 16  Temp:    SpO2: 100% 100%    Last Pain:  Vitals:   04/18/19 1815  TempSrc:   PainSc: 0-No pain                 Lucretia Kern

## 2019-04-19 LAB — BASIC METABOLIC PANEL
Anion gap: 5 (ref 5–15)
BUN: 15 mg/dL (ref 8–23)
CO2: 25 mmol/L (ref 22–32)
Calcium: 8.8 mg/dL — ABNORMAL LOW (ref 8.9–10.3)
Chloride: 105 mmol/L (ref 98–111)
Creatinine, Ser: 0.83 mg/dL (ref 0.61–1.24)
GFR calc Af Amer: >60 mL/min (ref >60)
GFR calc non Af Amer: >60 mL/min (ref >60)
Glucose, Bld: 182 mg/dL — ABNORMAL HIGH (ref 70–99)
Potassium: 4.6 mmol/L (ref 3.5–5.1)
Sodium: 135 mmol/L (ref 135–145)

## 2019-04-19 LAB — CBC
HCT: 40.4 % (ref 39.0–52.0)
Hemoglobin: 14.3 g/dL (ref 13.0–17.0)
MCH: 32.8 pg (ref 26.0–34.0)
MCHC: 35.4 g/dL (ref 30.0–36.0)
MCV: 92.7 fL (ref 80.0–100.0)
Platelets: 141 10*3/uL — ABNORMAL LOW (ref 150–400)
RBC: 4.36 MIL/uL (ref 4.22–5.81)
RDW: 12.3 % (ref 11.5–15.5)
WBC: 8.9 10*3/uL (ref 4.0–10.5)
nRBC: 0 % (ref 0.0–0.2)

## 2019-04-19 MED ORDER — ACETAMINOPHEN 500 MG PO TABS: 1000.0000 mg | ORAL_TABLET | Freq: Four times a day (QID) | ORAL | 0 refills | Status: AC

## 2019-04-19 MED ORDER — TAMSULOSIN HCL 0.4 MG PO CAPS: 0.4000 mg | ORAL_CAPSULE | Freq: Every day | ORAL | 0 refills | Status: AC

## 2019-04-19 MED ORDER — TRAMADOL HCL 50 MG PO TABS: 50.0000 mg | ORAL_TABLET | Freq: Four times a day (QID) | ORAL | 0 refills | Status: DC | PRN

## 2019-04-19 MED ORDER — TAMSULOSIN HCL 0.4 MG PO CAPS
0.4000 mg | ORAL_CAPSULE | Freq: Every day | ORAL | Status: DC
  Administered 2019-04-19: 0.4 mg via ORAL
  Filled 2019-04-19: qty 1

## 2019-04-19 NOTE — Discharge Summary (Signed)
Patient ID: Jesus Wagner [id number] [birth year] 84 y.o.  Admit date: 04/18/2019 Discharge date: 04/19/2019  Admitting Diagnosis: Acute appendicitis  Discharge Diagnosis Patient Active Problem List   Diagnosis Date Noted  . Acute appendicitis 04/18/2019  . S/P laparoscopic cholecystectomy 07/09/2018  . Arthritis of left hip 08/05/2013  . Status post THR (total hip replacement) 08/05/2013  . Varicose veins of lower extremities with other complications 03/31/2011    Consultants none  Reason for Admission: Patient is a 84 year old male with PMH significant for HTN and BPH who presented to [facility] after outpatient CT that showed acute appendicitis. CT was ordered by patient's gastroenterologist (Dr. Jeani Hawking). Patient reports 1 week history of mild abdominal pain that started in middle of abdomen and has now localized more to RLQ. Patient reports he thought he may have just been constipated and tried taking linzess at home. He has struggled with constipation/diarrhea issues since having his gallbladder removed. He had a very large BM this AM but pain persisted. Also reported associated nausea and vomiting. Reports some chills today but no fever. He has noted some increased urinary retention as well, but was able to urinate this AM. Denies chest pain, SOB, or sick contacts. Past abdominal surgeries include laparoscopic cholecystectomy in April 2020 and left inguinal hernia repair in the 1970s. He does not take any blood thinning medications. Allergic to aspirin and has nausea with codeine. 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   Procedures Lap appy with biopsy of nodule incidentally found, Dr. Michaell Cowing, 04/19/19  Hospital Course:  The patient was admitted and underwent a laparoscopic appendectomy.  The patient tolerated the procedure well.  On POD 1, the patient was tolerating a regular diet, mobilizing, and pain was controlled with  oral pain medications.  The patient was stable for DC home at this time with appropriate follow up made.  He has a history of BPH.  He is voiding well, but has a little sensation of urinary retention.  He had a post void residual bladder scan which did confirm some urine but not significant enough for a foley or to keep.  He was placed on flomax for 2 weeks at discharge to help with this post op.     Physical Exam: Abd: soft, appropriately tender, +BS, ND, incisions c/d/i  Allergies as of 04/19/2019      Reactions   Aspirin    Stomach upset    Codeine Nausea Only      Medication List    TAKE these medications   acetaminophen 500 MG tablet Commonly known as: TYLENOL Take 2 tablets (1,000 mg total) by mouth every 6 (six) hours.   amLODipine 5 MG tablet Commonly known as: NORVASC Take 7.5 mg by mouth every evening.   cholecalciferol 1000 units tablet Commonly known as: VITAMIN D Take 2,000 Units by mouth daily.   rosuvastatin 10 MG tablet Commonly known as: CRESTOR Take 10 mg by mouth daily.   tamsulosin 0.4 MG Caps capsule Commonly known as: FLOMAX Take 1 capsule (0.4 mg total) by mouth daily for 14 days.   traMADol 50 MG tablet Commonly known as: ULTRAM Take 1 tablet (50 mg total) by mouth every 6 (six) hours as needed for moderate pain.        Follow-up Information    [facility]. Call on 05/10/2019.   Specialty: General Surgery Why: Follow up appointment scheduled for 10:15 AM.  A provider will call you during scheduled appointment time. Please send a photo if incisions and name/DOB if able to [email or web address] the day prior to appointment.  Contact information: [address] [place] [address] [phone]           Signed: Saverio Danker, [facility] 04/19/2019, 10:56 AM Please see Amion for pager number during day hours 7:00am-4:30pm

## 2019-04-19 NOTE — Progress Notes (Signed)
D/C instructions given to patient. Patient had no questions. NT or writer will wheel patient out once family comes to pick him up  

## 2019-04-19 NOTE — ED Provider Notes (Signed)
[facility] Provider Note   CSN: [id number] Arrival date & time: 04/18/19  1308     History Chief Complaint  Patient presents with  . Abdominal Pain  . Nausea  . Emesis    Jesus Wagner is a 84 y.o. male.  Patient complains of periumbilical abdominal pain and right lower quadrant pain.  He was seen by his GI doctor yesterday and had a CT scan of the abdomen that showed appendicitis without perforation.  The history is provided by the patient. No language interpreter was used.  Abdominal Pain Pain location:  Generalized Pain quality: aching   Pain radiates to:  Does not radiate Pain severity:  Moderate Onset quality:  Sudden Timing:  Constant Progression:  Worsening Chronicity:  New Context: not alcohol use   Associated symptoms: vomiting   Associated symptoms: no chest pain, no cough, no diarrhea, no fatigue and no hematuria   Emesis Associated symptoms: abdominal pain   Associated symptoms: no cough, no diarrhea and no headaches        Past Medical History:  Diagnosis Date  . Hypertension   . Prostate enlargement     Patient Active Problem List   Diagnosis Date Noted  . Acute appendicitis 04/18/2019  . S/P laparoscopic cholecystectomy 07/09/2018  . Arthritis of left hip 08/05/2013  . Status post THR (total hip replacement) 08/05/2013  . Varicose veins of lower extremities with other complications 81/03/7508    Past Surgical History:  Procedure Laterality Date  . CHOLECYSTECTOMY N/A 07/09/2018   Procedure: LAPAROSCOPIC CHOLECYSTECTOMY;  Surgeon: Rolm Bookbinder, MD;  Location: [facility];  Service: General;  Laterality: N/A;  . COLONOSCOPY     2'5852  . HERNIA REPAIR     inguinal  . LAPAROSCOPIC APPENDECTOMY N/A 04/18/2019   Procedure: APPENDECTOMY LAPAROSCOPIC;  Surgeon: Michael Boston, MD;  Location: [facility];  Service: General;  Laterality: N/A;  . laser varicose vein    . TOTAL HIP ARTHROPLASTY Left 08/05/2013   Procedure: LEFT TOTAL HIP  ARTHROPLASTY ANTERIOR APPROACH;  Surgeon: Mcarthur Rossetti, MD;  Location: [facility];  Service: Orthopedics;  Laterality: Left;  Marland Kitchen VASECTOMY         History reviewed. No pertinent family history.  Social History   Tobacco Use  . Smoking status: Never Smoker  . Smokeless tobacco: Never Used  Substance Use Topics  . Alcohol use: No  . Drug use: No    Home Medications Prior to Admission medications   Medication Sig Start Date End Date Taking? Authorizing Provider  amLODipine (NORVASC) 5 MG tablet Take 7.5 mg by mouth every evening.   Yes [provider]  cholecalciferol (VITAMIN D) 1000 UNITS tablet Take 2,000 Units by mouth daily.    Yes [provider]  rosuvastatin (CRESTOR) 10 MG tablet Take 10 mg by mouth daily.  02/05/15  Yes [provider]    Allergies    Aspirin and Codeine  Review of Systems   Review of Systems  Constitutional: Negative for appetite change and fatigue.  HENT: Negative for congestion, ear discharge and sinus pressure.   Eyes: Negative for discharge.  Respiratory: Negative for cough.   Cardiovascular: Negative for chest pain.  Gastrointestinal: Positive for abdominal pain and vomiting. Negative for diarrhea.  Genitourinary: Negative for frequency and hematuria.  Musculoskeletal: Negative for back pain.  Skin: Negative for rash.  Neurological: Negative for seizures and headaches.  Psychiatric/Behavioral: Negative for hallucinations.    Physical Exam Updated Vital Signs BP 133/63 (BP Location:  Left Arm)   Pulse 62   Temp (!) 97.3 F (36.3 C) (Oral)   Resp 18   Ht 6' (1.829 m)   Wt 86.2 kg   SpO2 100%   BMI 25.77 kg/m   Physical Exam Vitals and nursing note reviewed.  Constitutional:      Appearance: He is well-developed.  HENT:     Head: Normocephalic.     Nose: Nose normal.  Eyes:     General: No scleral icterus.    Conjunctiva/sclera: Conjunctivae normal.  Neck:     Thyroid: No thyromegaly.    Cardiovascular:     Rate and Rhythm: Normal rate and regular rhythm.     Heart sounds: No murmur. No friction rub. No gallop.   Pulmonary:     Breath sounds: No stridor. No wheezing or rales.  Chest:     Chest wall: No tenderness.  Abdominal:     General: There is no distension.     Tenderness: There is abdominal tenderness. There is no rebound.  Musculoskeletal:        General: Normal range of motion.     Cervical back: Neck supple.  Lymphadenopathy:     Cervical: No cervical adenopathy.  Skin:    Findings: No erythema or rash.  Neurological:     Mental Status: He is alert and oriented to person, place, and time.     Motor: No abnormal muscle tone.     Coordination: Coordination normal.  Psychiatric:        Behavior: Behavior normal.     ED Results / Procedures / Treatments   Labs (all labs ordered are listed, but only abnormal results are displayed) Labs Reviewed  CBC WITH DIFFERENTIAL/PLATELET - Abnormal; Notable for the following components:      Result Value   WBC 10.9 (*)    MCHC 36.2 (*)    Platelets 137 (*)    Neutro Abs 9.7 (*)    All other components within normal limits  COMPREHENSIVE METABOLIC PANEL - Abnormal; Notable for the following components:   Glucose, Bld 130 (*)    Total Bilirubin 2.6 (*)    All other components within normal limits  CBC - Abnormal; Notable for the following components:   Platelets 141 (*)    All other components within normal limits  BASIC METABOLIC PANEL - Abnormal; Notable for the following components:   Glucose, Bld 182 (*)    Calcium 8.8 (*)    All other components within normal limits  RESPIRATORY PANEL BY RT PCR (FLU A&B, COVID)  MRSA PCR SCREENING  SURGICAL PATHOLOGY    EKG None  Radiology CT ABDOMEN PELVIS W CONTRAST  Result Date: 04/18/2019 CLINICAL DATA:  Nausea and vomiting and constipation. EXAM: CT ABDOMEN AND PELVIS WITH CONTRAST TECHNIQUE: Multidetector CT imaging of the abdomen and pelvis was performed  using the standard protocol following bolus administration of intravenous contrast. CONTRAST:  ISOVUE-300 IOPAMIDOL (ISOVUE-300) INJECTION 61% COMPARISON:  07/09/2018 FINDINGS: Lower chest: The lung bases are clear of an acute process. Stable mild right lower lobe bronchiectasis and stable cluster of calcifications in the left lower lobe. No infiltrates or effusions. The heart is normal in size. No pericardial effusion. Small hiatal hernia noted. Moderate distal aortic calcifications. Hepatobiliary: A few tiny scattered cysts are noted. Small calcified granuloma at the left hepatic dome. The gallbladder is surgically absent. No intra or extrahepatic biliary dilatation. The portal and hepatic veins are patent. Pancreas: No mass, inflammation or ductal dilatation.  Spleen: Stable calcified granulomas. Adrenals/Urinary Tract: The adrenal glands and kidneys are unremarkable. A few tiny renal cysts are noted. No worrisome renal lesions or hydroureteronephrosis. The bladder is unremarkable. Stomach/Bowel: The stomach, duodenum, small bowel and colon are unremarkable. No acute inflammatory changes, mass lesions or obstructive findings. The terminal ileum is normal. The appendix is markedly distended and fluid-filled and contains 2 appendicoliths, the largest measuring 8 mm. This is at the appendiceal orifice and is likely obstructing the appendix. Mild surrounding inflammatory changes. No findings for perforation but the appendix is dilated up to 19.5 mm. Vascular/Lymphatic: Moderate atherosclerotic calcifications involving the aorta and iliac arteries but no aneurysm or dissection. The branch vessels are patent. The major venous structures are patent. No mesenteric or retroperitoneal mass or adenopathy. Reproductive: Mild prostate gland enlargement. Suspect prior TURP. The seminal vesicles appear normal. Other: No pelvic mass or adenopathy. No free pelvic fluid collections. No inguinal mass or adenopathy. No  abdominal wall hernia or subcutaneous lesions. Musculoskeletal: No significant bony findings. Total left hip arthroplasty noted. IMPRESSION: CT findings consistent with acute appendicitis. Significant distension of the appendix which contains 2 appendicoliths. No findings for rupture. These results will be called to the ordering clinician or representative by the Radiologist Assistant, and communication documented in the PACS or zVision Dashboard. Electronically Signed   By: Rudie Meyer M.D.   On: 04/18/2019 12:12    Procedures Procedures (including critical care time)  Medications Ordered in ED Medications  cholecalciferol (VITAMIN D3) tablet 2,000 Units (has no administration in time range)  amLODipine (NORVASC) tablet 7.5 mg (7.5 mg Oral Given 04/18/19 2029)  losartan (COZAAR) tablet 100 mg (100 mg Oral Given 04/18/19 1953)  enoxaparin (LOVENOX) injection 40 mg (has no administration in time range)  dextrose 5 % and 0.45 % NaCl with KCl 20 mEq/L infusion ( Intravenous New Bag/Given 04/18/19 1952)  acetaminophen (TYLENOL) tablet 1,000 mg (1,000 mg Oral Given 04/19/19 0550)  gabapentin (NEURONTIN) capsule 300 mg (300 mg Oral Given 04/18/19 2150)  oxyCODONE (Oxy IR/ROXICODONE) immediate release tablet 5-10 mg (has no administration in time range)  metoprolol tartrate (LOPRESSOR) injection 5 mg (has no administration in time range)  docusate sodium (COLACE) capsule 100 mg (100 mg Oral Given 04/18/19 2150)  polyethylene glycol (MIRALAX / GLYCOLAX) packet 17 g (has no administration in time range)  rosuvastatin (CRESTOR) tablet 10 mg (10 mg Oral Given 04/18/19 1953)  sodium chloride flush (NS) 0.9 % injection 3 mL (3 mLs Intravenous Given 04/18/19 2151)  sodium chloride flush (NS) 0.9 % injection 3 mL (has no administration in time range)  0.9 %  sodium chloride infusion (has no administration in time range)  lactated ringers bolus 1,000 mL (has no administration in time range)  traMADol (ULTRAM)  tablet 50-100 mg (has no administration in time range)  fentaNYL (SUBLIMAZE) injection 25-50 mcg (has no administration in time range)  ondansetron (ZOFRAN) injection 4 mg (has no administration in time range)    Or  ondansetron (ZOFRAN) 8 mg in sodium chloride 0.9 % 50 mL IVPB (has no administration in time range)  prochlorperazine (COMPAZINE) injection 5-10 mg (has no administration in time range)  lip balm (CARMEX) ointment 1 application (1 application Topical Given 04/18/19 1956)  magic mouthwash (has no administration in time range)  guaiFENesin-dextromethorphan (ROBITUSSIN DM) 100-10 MG/5ML syrup 10 mL (has no administration in time range)  hydrocortisone (ANUSOL-HC) 2.5 % rectal cream 1 application (has no administration in time range)  alum & mag hydroxide-simeth (MAALOX/MYLANTA) 200-200-20  MG/5ML suspension 30 mL (has no administration in time range)  hydrocortisone cream 1 % 1 application (has no administration in time range)  menthol-cetylpyridinium (CEPACOL) lozenge 3 mg (has no administration in time range)  phenol (CHLORASEPTIC) mouth spray 1-2 spray (has no administration in time range)  enalaprilat (VASOTEC) injection 0.625-1.25 mg (has no administration in time range)  pneumococcal 23 valent vaccine (PNEUMOVAX-23) injection 0.5 mL (has no administration in time range)  sodium chloride 0.9 % bolus 500 mL (500 mLs Intravenous New Bag/Given 04/18/19 1334)  HYDROmorphone (DILAUDID) injection 1 mg (1 mg Intravenous Given 04/18/19 1342)  ondansetron (ZOFRAN) injection 4 mg (4 mg Intravenous Given 04/18/19 1342)  gabapentin (NEURONTIN) capsule 300 mg (300 mg Oral Given 04/18/19 1530)  acetaminophen (TYLENOL) tablet 1,000 mg (1,000 mg Oral Given 04/18/19 1530)  piperacillin-tazobactam (ZOSYN) 3.375 GM/50ML IVPB (  Override pull for Anesthesia 04/18/19 1651)    ED Course  I have reviewed the triage vital signs and the nursing notes.  Pertinent labs & imaging results that were available  during my care of the patient were reviewed by me and considered in my medical decision making (see chart for details).    MDM Rules/Calculators/A&P                      Tender right lower quadrant.  Patient with appendicitis.  Will call general surgery.  General surgery has agreed to see the patient Final Clinical Impression(s) / ED Diagnoses Final diagnoses:  Other acute appendicitis    Rx / DC Orders ED Discharge Orders    None       Bethann Berkshire, MD 04/19/19 [phone]

## 2019-04-20 LAB — SURGICAL PATHOLOGY

## 2019-10-19 ENCOUNTER — Ambulatory Visit (INDEPENDENT_AMBULATORY_CARE_PROVIDER_SITE_OTHER): Payer: Medicare PPO

## 2019-10-19 ENCOUNTER — Ambulatory Visit: Payer: Medicare PPO | Admitting: Orthopaedic Surgery

## 2019-10-19 DIAGNOSIS — Z96642 Presence of left artificial hip joint: Secondary | ICD-10-CM | POA: Diagnosis not present

## 2019-10-19 DIAGNOSIS — M25551 Pain in right hip: Secondary | ICD-10-CM | POA: Diagnosis not present

## 2019-10-19 NOTE — Progress Notes (Signed)
Office Visit Note   Patient: Jesus Wagner           Date of Birth: 21-May-1935           MRN: [id number] Visit Date: 10/19/2019              Requested by: Creola Corn, MD [address] [place],  [place] [address] PCP: Creola Corn, MD   Assessment & Plan: Visit Diagnoses:  1. History of left hip replacement   2. Pain in right hip     Plan: I gave the patient reassurance that his hip replacement was good.  I think that he developed some tendinitis and bursitis from getting back into exercise activities especially with treading in the water in the pool.  He can try Voltaren gel on the hip and stretching exercises and this should do well with time.  If this worsens in any way he will let us know.  All questions and concerns were answered and addressed.  Follow-up can be as needed.  Follow-Up Instructions: Return if symptoms worsen or fail to improve.   Orders:  Orders Placed This Encounter  Procedures  . XR HIPS BILAT W OR W/O PELVIS 2V   No orders of the defined types were placed in this encounter.     Procedures: No procedures performed   Clinical Data: No additional findings.   Subjective: Chief Complaint  Patient presents with  . Right Hip - Pain  . Left Hip - Pain  The patient is an 84 year old gentleman well-known to me.  He is 6 years out from a left total hip arthroplasty.  About 2 weeks ago he got back into water aerobics and started having pain on just the lateral aspect of his left hip.  He is also been using a stationary bike.  He denies any right hip pain but wants both his hips checked out today.  He said since he made the appointment 2 weeks ago the pain is easing off.  He denies any groin pain.  He denies any acute change in medical status.  He is otherwise been healthy.  HPI  Review of Systems There is currently no reported headache, chest pain, shortness of breath, fever, chills, nausea, vomiting  Objective: Vital Signs: There were no vitals taken  for this visit.  Physical Exam He is alert and orient x3 and in no acute distress Ortho Exam Examination of both hips shows the move smoothly and fluidly.  He has a little bit of pain over the trochanteric area to palpation on the left hip. Specialty Comments:  No specialty comments available.  Imaging: XR HIPS BILAT W OR W/O PELVIS 2V  Result Date: 10/19/2019 An AP pelvis and lateral of the right and left hip shows a well-seated total hip arthroplasty on the left side.  There is no evidence of loosening.  The right hip has no acute changes in a well maintained joint space.    PMFS History: Patient Active Problem List   Diagnosis Date Noted  . Acute appendicitis 04/18/2019  . S/P laparoscopic cholecystectomy 07/09/2018  . Arthritis of left hip 08/05/2013  . Status post THR (total hip replacement) 08/05/2013  . Varicose veins of lower extremities with other complications 03/31/2011   Past Medical History:  Diagnosis Date  . Hypertension   . Prostate enlargement     No family history on file.  Past Surgical History:  Procedure Laterality Date  . CHOLECYSTECTOMY N/A 07/09/2018   Procedure: LAPAROSCOPIC CHOLECYSTECTOMY;  Surgeon:  Emelia Loron, MD;  Location: [facility];  Service: General;  Laterality: N/A;  . COLONOSCOPY     8'0223  . HERNIA REPAIR     inguinal  . LAPAROSCOPIC APPENDECTOMY N/A 04/18/2019   Procedure: APPENDECTOMY LAPAROSCOPIC;  Surgeon: Karie Soda, MD;  Location: [facility];  Service: General;  Laterality: N/A;  . laser varicose vein    . TOTAL HIP ARTHROPLASTY Left 08/05/2013   Procedure: LEFT TOTAL HIP ARTHROPLASTY ANTERIOR APPROACH;  Surgeon: Kathryne Hitch, MD;  Location: [facility];  Service: Orthopedics;  Laterality: Left;  Marland Kitchen VASECTOMY     Social History   Occupational History  . Not on file  Tobacco Use  . Smoking status: Never Smoker  . Smokeless tobacco: Never Used  Substance and Sexual Activity  . Alcohol use: No  . Drug use: No  .  Sexual activity: Not on file

## 2019-12-28 DIAGNOSIS — I8391 Asymptomatic varicose veins of right lower extremity: Secondary | ICD-10-CM | POA: Diagnosis not present

## 2019-12-28 DIAGNOSIS — D225 Melanocytic nevi of trunk: Secondary | ICD-10-CM | POA: Diagnosis not present

## 2019-12-28 DIAGNOSIS — L820 Inflamed seborrheic keratosis: Secondary | ICD-10-CM | POA: Diagnosis not present

## 2019-12-28 DIAGNOSIS — L821 Other seborrheic keratosis: Secondary | ICD-10-CM | POA: Diagnosis not present

## 2019-12-31 DIAGNOSIS — Z23 Encounter for immunization: Secondary | ICD-10-CM | POA: Diagnosis not present

## 2020-01-25 DIAGNOSIS — H524 Presbyopia: Secondary | ICD-10-CM | POA: Diagnosis not present

## 2020-01-25 DIAGNOSIS — Z961 Presence of intraocular lens: Secondary | ICD-10-CM | POA: Diagnosis not present

## 2020-03-17 IMAGING — US ULTRASOUND ABDOMEN LIMITED
1 series · 14 of 25 positions shown · non-contrast
Comparison: CT of the abdomen and pelvis earlier today per

CLINICAL DATA: Abdominal pain and vomiting.  Cholelithiasis by CT.

EXAM:
ULTRASOUND ABDOMEN LIMITED RIGHT UPPER QUADRANT

[Series 1: ultrasound abdomen limited · 14 of 32 slices shown]
[im 1/32]
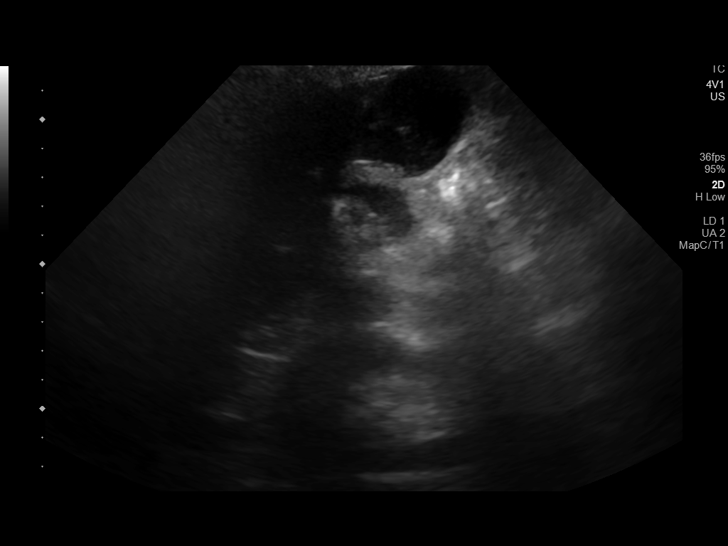
[im 3/32]
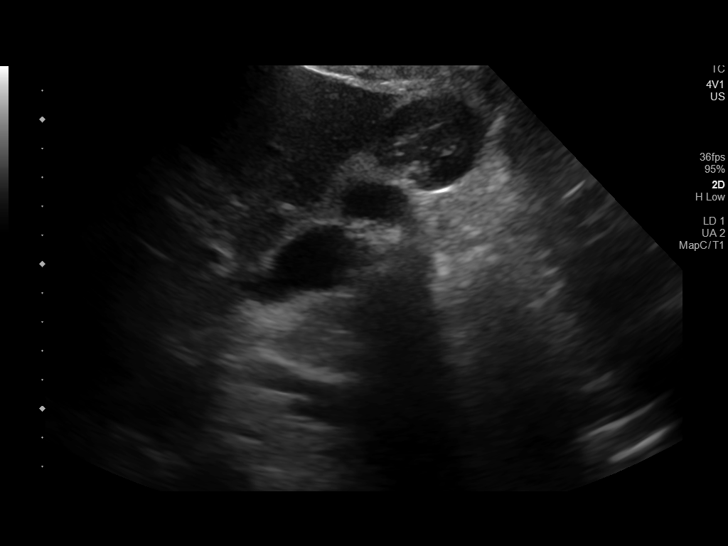
[im 6/32]
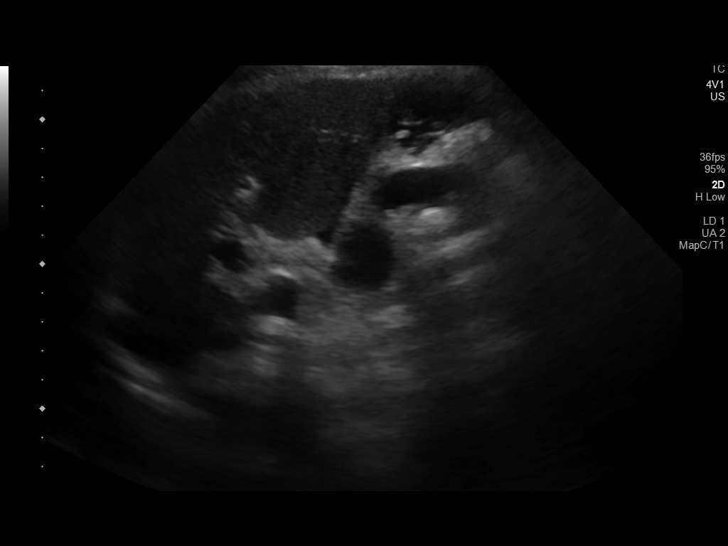
[im 8/32]
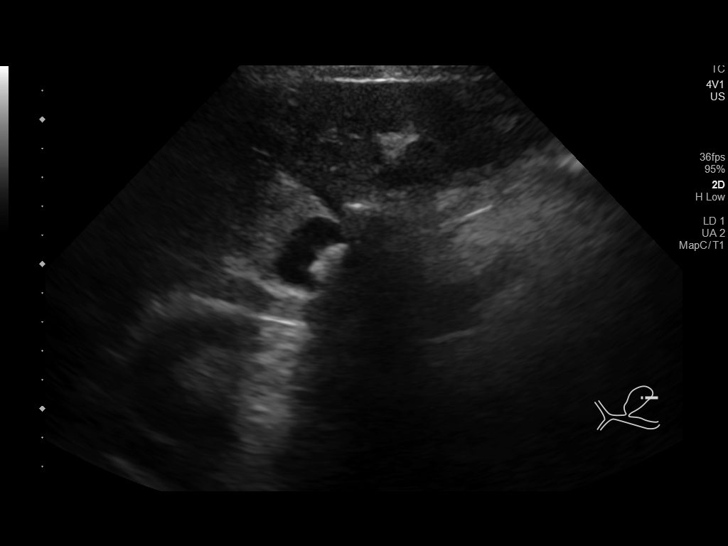
[im 11/32]
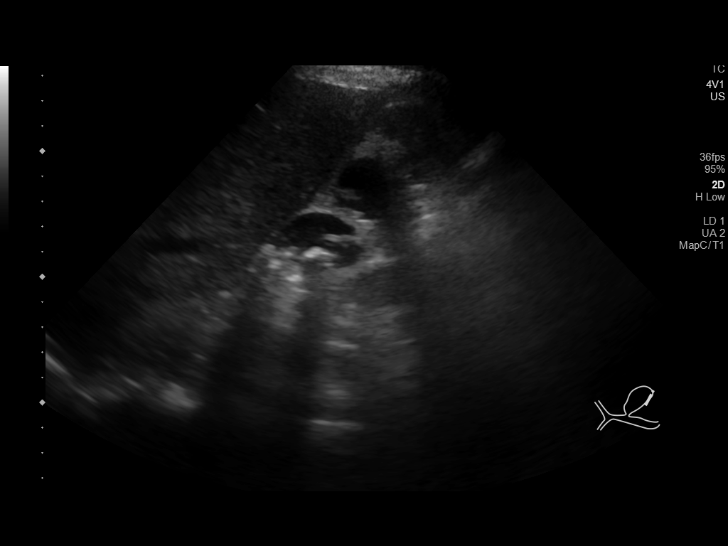
[im 12/32]
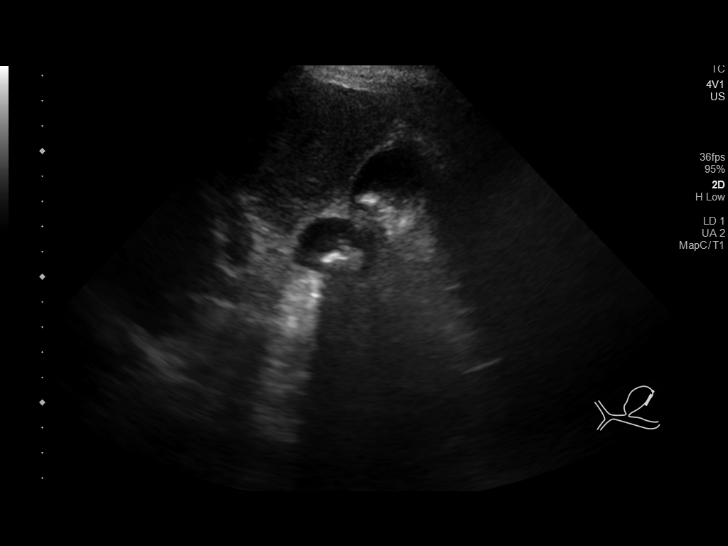
[im 15/32]
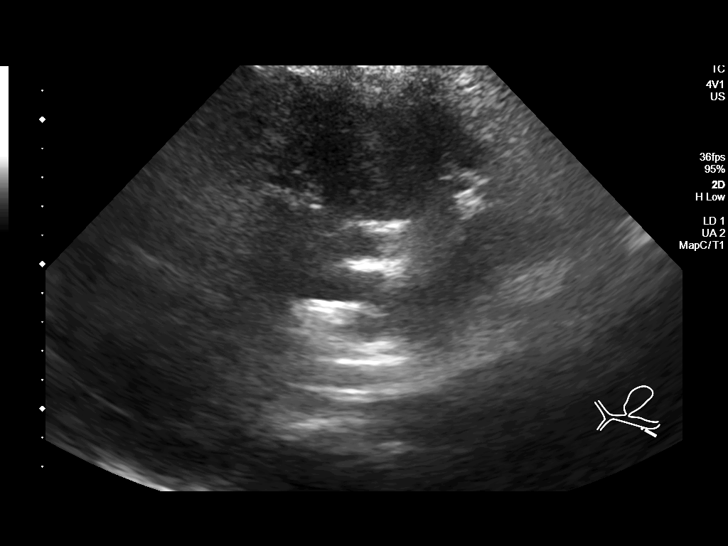
[im 17/32]
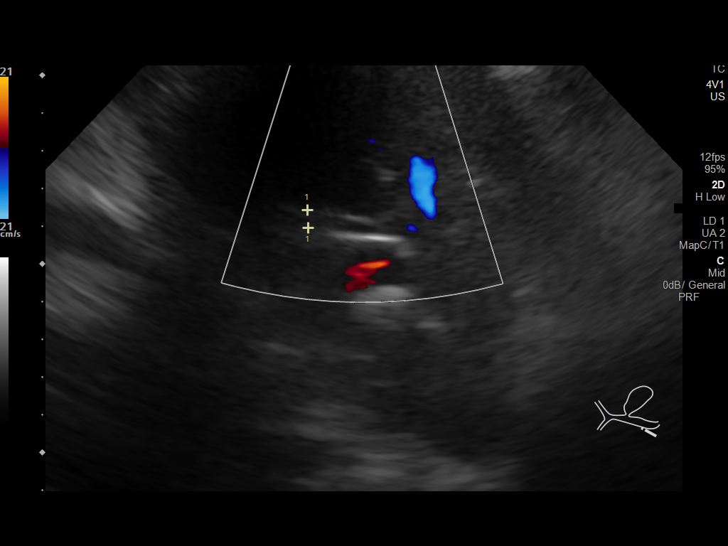
[im 20/32]
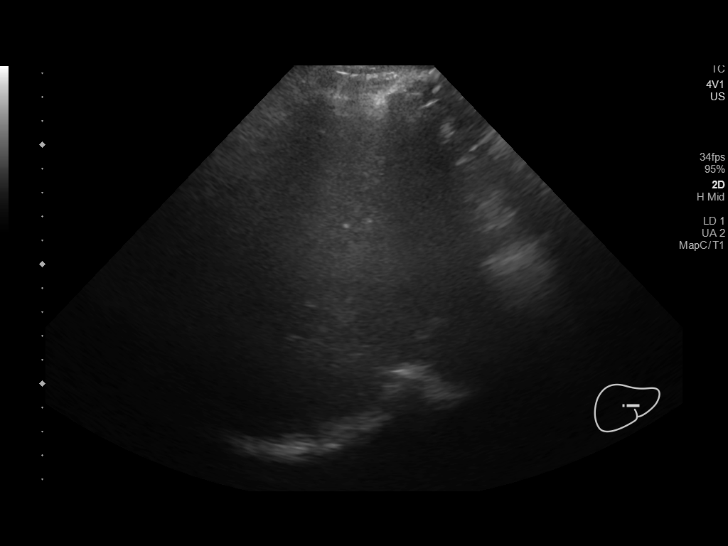
[im 21/32]
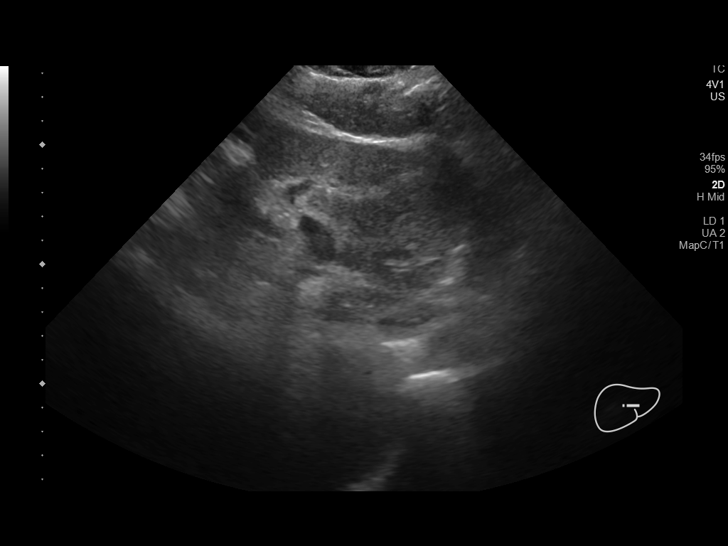
[im 24/32]
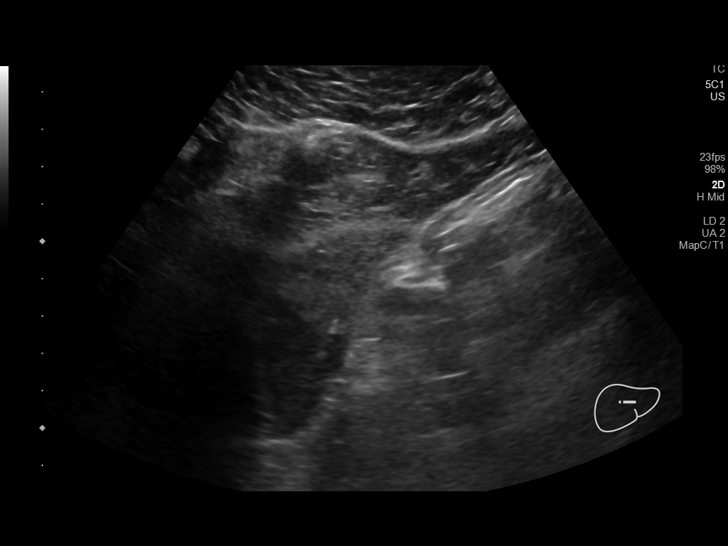
[im 26/32]
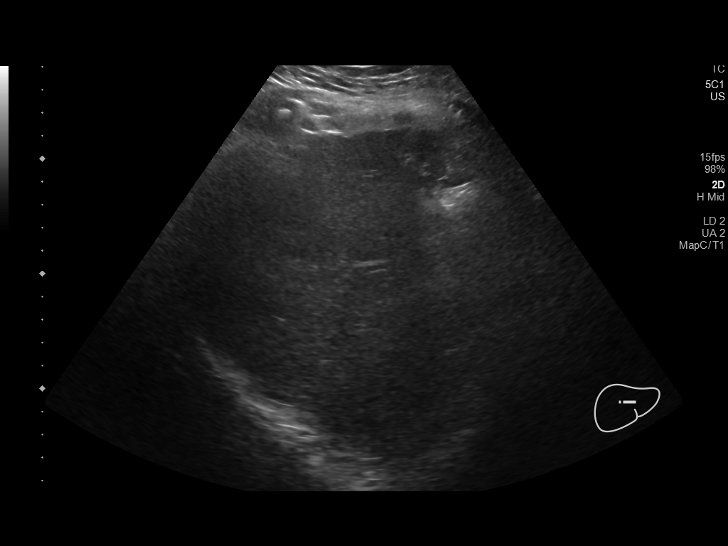
[im 29/32]
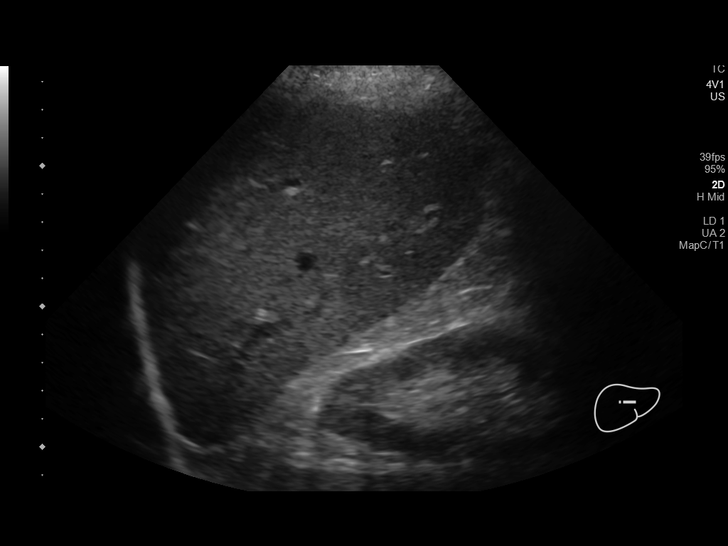
[im 32/32]
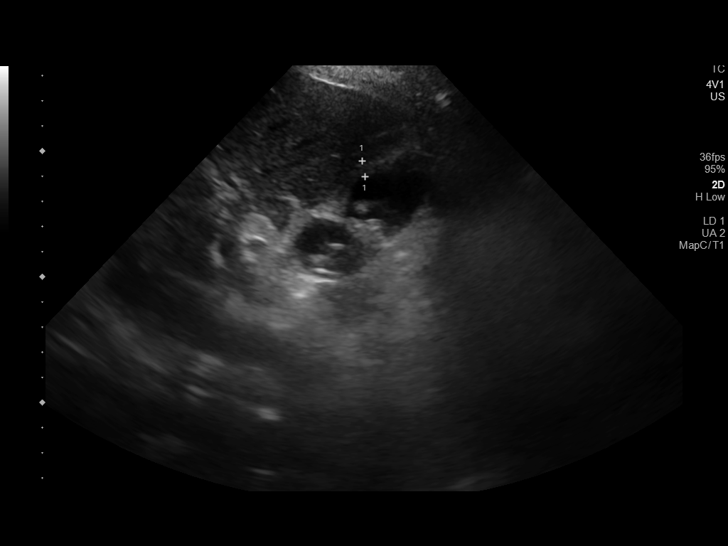

[14 of 25 positions shown; findings below may reference images not displayed]

FINDINGS: Gallbladder:

Multiple gallstones identified in the gallbladder. The gallbladder
is not distended but does demonstrate some probable wall thickening
approaching 5-6 mm. The patient was tender over the region of the
gallbladder and findings may relate to chronic cholecystitis. No
pericholecystic fluid identified.

Common bile duct:

Diameter: 5 mm

Liver:

Mildly increased parenchymal echogenicity likely reflects mild
steatosis. No overt cirrhosis, focal mass or intrahepatic biliary
dilatation. Portal vein is patent on color Doppler imaging with
normal direction of blood flow towards the liver.
IMPRESSION: Cholelithiasis with nondistended gallbladder, gallbladder wall
thickening and some tenderness over the region of the gallbladder
during examination. Findings may relate to chronic cholecystitis.
There is no evidence pericholecystic fluid or biliary obstruction.

## 2020-04-05 DIAGNOSIS — L299 Pruritus, unspecified: Secondary | ICD-10-CM | POA: Diagnosis not present

## 2020-04-05 DIAGNOSIS — I878 Other specified disorders of veins: Secondary | ICD-10-CM | POA: Diagnosis not present

## 2020-04-05 DIAGNOSIS — T148XXA Other injury of unspecified body region, initial encounter: Secondary | ICD-10-CM | POA: Diagnosis not present

## 2020-04-05 DIAGNOSIS — L309 Dermatitis, unspecified: Secondary | ICD-10-CM | POA: Diagnosis not present

## 2020-04-05 DIAGNOSIS — I10 Essential (primary) hypertension: Secondary | ICD-10-CM | POA: Diagnosis not present

## 2020-09-10 DIAGNOSIS — R059 Cough, unspecified: Secondary | ICD-10-CM | POA: Diagnosis not present

## 2020-09-10 DIAGNOSIS — U071 COVID-19: Secondary | ICD-10-CM | POA: Diagnosis not present

## 2020-09-10 DIAGNOSIS — Z1152 Encounter for screening for COVID-19: Secondary | ICD-10-CM | POA: Diagnosis not present

## 2020-09-10 DIAGNOSIS — R5383 Other fatigue: Secondary | ICD-10-CM | POA: Diagnosis not present

## 2020-09-13 DIAGNOSIS — Z125 Encounter for screening for malignant neoplasm of prostate: Secondary | ICD-10-CM | POA: Diagnosis not present

## 2020-09-13 DIAGNOSIS — E559 Vitamin D deficiency, unspecified: Secondary | ICD-10-CM | POA: Diagnosis not present

## 2020-09-13 DIAGNOSIS — E785 Hyperlipidemia, unspecified: Secondary | ICD-10-CM | POA: Diagnosis not present

## 2020-09-13 DIAGNOSIS — Z0000 Encounter for general adult medical examination without abnormal findings: Secondary | ICD-10-CM | POA: Diagnosis not present

## 2020-09-14 DIAGNOSIS — K5900 Constipation, unspecified: Secondary | ICD-10-CM | POA: Diagnosis not present

## 2020-09-14 DIAGNOSIS — E785 Hyperlipidemia, unspecified: Secondary | ICD-10-CM | POA: Diagnosis not present

## 2020-09-20 DIAGNOSIS — Z1331 Encounter for screening for depression: Secondary | ICD-10-CM | POA: Diagnosis not present

## 2020-09-20 DIAGNOSIS — N401 Enlarged prostate with lower urinary tract symptoms: Secondary | ICD-10-CM | POA: Diagnosis not present

## 2020-09-20 DIAGNOSIS — Z1389 Encounter for screening for other disorder: Secondary | ICD-10-CM | POA: Diagnosis not present

## 2020-09-20 DIAGNOSIS — M810 Age-related osteoporosis without current pathological fracture: Secondary | ICD-10-CM | POA: Diagnosis not present

## 2020-09-20 DIAGNOSIS — I10 Essential (primary) hypertension: Secondary | ICD-10-CM | POA: Diagnosis not present

## 2020-09-20 DIAGNOSIS — Z0000 Encounter for general adult medical examination without abnormal findings: Secondary | ICD-10-CM | POA: Diagnosis not present

## 2020-09-20 DIAGNOSIS — E785 Hyperlipidemia, unspecified: Secondary | ICD-10-CM | POA: Diagnosis not present

## 2020-09-20 DIAGNOSIS — R946 Abnormal results of thyroid function studies: Secondary | ICD-10-CM | POA: Diagnosis not present

## 2020-09-20 DIAGNOSIS — R82998 Other abnormal findings in urine: Secondary | ICD-10-CM | POA: Diagnosis not present

## 2020-09-20 DIAGNOSIS — M1990 Unspecified osteoarthritis, unspecified site: Secondary | ICD-10-CM | POA: Diagnosis not present

## 2020-09-20 DIAGNOSIS — R059 Cough, unspecified: Secondary | ICD-10-CM | POA: Diagnosis not present

## 2020-09-20 DIAGNOSIS — I878 Other specified disorders of veins: Secondary | ICD-10-CM | POA: Diagnosis not present

## 2020-10-18 DIAGNOSIS — M810 Age-related osteoporosis without current pathological fracture: Secondary | ICD-10-CM | POA: Diagnosis not present

## 2021-01-09 DIAGNOSIS — D225 Melanocytic nevi of trunk: Secondary | ICD-10-CM | POA: Diagnosis not present

## 2021-01-09 DIAGNOSIS — L820 Inflamed seborrheic keratosis: Secondary | ICD-10-CM | POA: Diagnosis not present

## 2021-01-09 DIAGNOSIS — I788 Other diseases of capillaries: Secondary | ICD-10-CM | POA: Diagnosis not present

## 2021-01-09 DIAGNOSIS — L821 Other seborrheic keratosis: Secondary | ICD-10-CM | POA: Diagnosis not present

## 2021-01-24 DIAGNOSIS — H524 Presbyopia: Secondary | ICD-10-CM | POA: Diagnosis not present

## 2021-01-24 DIAGNOSIS — Z961 Presence of intraocular lens: Secondary | ICD-10-CM | POA: Diagnosis not present

## 2021-03-14 ENCOUNTER — Encounter: Payer: Self-pay | Admitting: Sports Medicine

## 2021-03-14 ENCOUNTER — Ambulatory Visit: Payer: Medicare PPO | Admitting: Sports Medicine

## 2021-03-14 ENCOUNTER — Other Ambulatory Visit: Payer: Self-pay

## 2021-03-14 DIAGNOSIS — L603 Nail dystrophy: Secondary | ICD-10-CM | POA: Diagnosis not present

## 2021-03-14 DIAGNOSIS — M792 Neuralgia and neuritis, unspecified: Secondary | ICD-10-CM

## 2021-03-14 NOTE — Progress Notes (Signed)
Subjective: Jesus Wagner is a 85 y.o. male patient seen today in office with complaint of mildly painful big toenails due to ingrown's and right fourth toenail that is thickened, unable to trim.  Reports that he also gets some awkward sensation to his toes and now finger tips.   Review of Systems  All other systems reviewed and are negative.   Patient Active Problem List   Diagnosis Date Noted   Acute appendicitis 04/18/2019   S/P laparoscopic cholecystectomy 07/09/2018   Arthritis of left hip 08/05/2013   Status post THR (total hip replacement) 08/05/2013   Varicose veins of lower extremities with other complications 03/31/2011    Current Outpatient Medications on File Prior to Visit  Medication Sig Dispense Refill   acetaminophen (TYLENOL) 500 MG tablet Take 2 tablets (1,000 mg total) by mouth every 6 (six) hours. 30 tablet 0   amLODipine (NORVASC) 5 MG tablet Take 7.5 mg by mouth every evening.     cholecalciferol (VITAMIN D) 1000 UNITS tablet Take 2,000 Units by mouth daily.      rosuvastatin (CRESTOR) 10 MG tablet Take 10 mg by mouth daily.      traMADol (ULTRAM) 50 MG tablet Take 1 tablet (50 mg total) by mouth every 6 (six) hours as needed for moderate pain. 10 tablet 0   No current facility-administered medications on file prior to visit.    Allergies  Allergen Reactions   Aspirin     Stomach upset    Codeine Nausea Only    Objective: Physical Exam  General: Well developed, nourished, no acute distress, awake, alert and oriented x 3  Vascular: Dorsalis pedis artery 1/4 bilateral, Posterior tibial artery 1/4 bilateral, skin temperature warm to warm proximal to distal bilateral lower extremities, mild varicosities, no pedal hair present bilateral.  Neurological: Gross sensation present via light touch bilateral. Subjective sensation to toes.   Dermatological: Skin is warm, dry, and supple bilateral, bilateral hallux and right fourth toenails are tender, long, thick,  and discolored with mild subungal debris, no ingrowing, no callus/corns/hyperkeratotic tissue present bilateral. No signs of infection bilateral.  Musculoskeletal: Asymptomatic hammertoe/overlapping boney deformities noted bilateral. Muscular strength within normal limits without painon range of motion. No pain with calf compression bilateral.  Assessment and Plan:  Problem List Items Addressed This Visit   None Visit Diagnoses     Nail dystrophy    -  Primary   Neuritis          -Examined patient.  -Discussed treatment options for painful mycotic nails. -Mechanically debrided and reduced painful nails x3 with sterile nail nipper and dremel nail file without incident. -Recommend vicks vapor rub to nail -Recommend Nervive nerve relief -Recommend pickle juice for any pain or cramping  -Patient to return PRN or sooner if symptoms worsen.  Asencion Islam, DPM

## 2021-03-14 NOTE — Patient Instructions (Signed)
Nervive nerve relief supplement for your nerves can be purchased OTC at [facility]   Apply vicks vapor rub to thick toenails.  Recommend Pickle juice for pain and cramping

## 2021-04-18 DIAGNOSIS — L309 Dermatitis, unspecified: Secondary | ICD-10-CM | POA: Diagnosis not present

## 2021-04-18 DIAGNOSIS — I10 Essential (primary) hypertension: Secondary | ICD-10-CM | POA: Diagnosis not present

## 2021-04-18 DIAGNOSIS — R946 Abnormal results of thyroid function studies: Secondary | ICD-10-CM | POA: Diagnosis not present

## 2021-04-18 DIAGNOSIS — R059 Cough, unspecified: Secondary | ICD-10-CM | POA: Diagnosis not present

## 2021-04-18 DIAGNOSIS — M810 Age-related osteoporosis without current pathological fracture: Secondary | ICD-10-CM | POA: Diagnosis not present

## 2021-04-18 DIAGNOSIS — J984 Other disorders of lung: Secondary | ICD-10-CM | POA: Diagnosis not present

## 2021-04-18 DIAGNOSIS — G3184 Mild cognitive impairment, so stated: Secondary | ICD-10-CM | POA: Diagnosis not present

## 2021-04-18 DIAGNOSIS — N401 Enlarged prostate with lower urinary tract symptoms: Secondary | ICD-10-CM | POA: Diagnosis not present

## 2021-04-18 DIAGNOSIS — E785 Hyperlipidemia, unspecified: Secondary | ICD-10-CM | POA: Diagnosis not present

## 2021-07-04 ENCOUNTER — Telehealth: Payer: Self-pay

## 2021-07-04 NOTE — Telephone Encounter (Signed)
Patient came into the office and stated he spoke with someone yesterday and was told he had an appointment today 04/13 but his actual appointment is for 04/20 next week; Patient  previously had his left hip replaced the new appointment is for his right hip he states when he puts pressure on the right hip he feel a warm sensation and if he lays on it at night is  becomes sore. Was unsure if it was cause for concern. ?

## 2021-07-04 NOTE — Telephone Encounter (Signed)
Lvm for pt to cb to inform 

## 2021-07-11 ENCOUNTER — Ambulatory Visit (INDEPENDENT_AMBULATORY_CARE_PROVIDER_SITE_OTHER): Payer: Medicare PPO

## 2021-07-11 ENCOUNTER — Encounter: Payer: Self-pay | Admitting: Orthopaedic Surgery

## 2021-07-11 ENCOUNTER — Ambulatory Visit (INDEPENDENT_AMBULATORY_CARE_PROVIDER_SITE_OTHER): Payer: Medicare PPO | Admitting: Orthopaedic Surgery

## 2021-07-11 DIAGNOSIS — Z96641 Presence of right artificial hip joint: Secondary | ICD-10-CM

## 2021-07-11 DIAGNOSIS — M542 Cervicalgia: Secondary | ICD-10-CM | POA: Diagnosis not present

## 2021-07-11 NOTE — Progress Notes (Signed)
The patient is a very pleasant 86 year old gentleman who has had 2 weeks of feeling some type of warmth around his right hip at the trochanteric areas where he points to.  He has had a little bit of pain when he lays on that hip at night and some on his left hip to in terms of pain over the lateral aspect of the left hip.  We replaced that left hip several years ago.  He is also had some neck pain and has had some triggering in his fingers.  He does try Voltaren gel in these areas.  He does go to some type of class for working out.  He says his neck is very stiff.  He denies any numbness and tingling in his upper or lower extremities.  At 59 he is active.  I have known him for long period time.  He is accomplished Education administrator. ? ?His neck is stiff with lateral rotation and bending.  It hurts in the paraspinal muscles and in the midline.  He has good range of motion but it is definitely stiff and painful.  His right hip shows no redness and just some slight pain over the trochanteric area.  His range of motion is full of both hips. ? ?An AP pelvis and lateral right hip shows no cortical irregularity around either trochanteric area.  There is a well-seated total hip arthroplasty on the left side.  The right hip joint space is well-maintained.  2 views of the cervical spine show severe degenerative changes and degenerative disc disease between C5-C7 with also facet joint arthritis at several levels.  There is no significant malalignment. ? ?He does have arthritis in his neck and he understands this.  He can try Voltaren gel in these areas.  He can also try this over the trochanteric area of both hips.  My next step would be for him to consider physical therapy if things are not feeling better.  He did let me there is some triggering in his fingers but there is no active triggering today.  He can try Voltaren gel for these areas as well.  All questions and concerns were answered and addressed. ?

## 2021-07-15 DIAGNOSIS — K5900 Constipation, unspecified: Secondary | ICD-10-CM | POA: Diagnosis not present

## 2021-08-17 DIAGNOSIS — H6122 Impacted cerumen, left ear: Secondary | ICD-10-CM | POA: Diagnosis not present

## 2021-10-07 ENCOUNTER — Ambulatory Visit (INDEPENDENT_AMBULATORY_CARE_PROVIDER_SITE_OTHER): Payer: Medicare PPO

## 2021-10-07 ENCOUNTER — Encounter: Payer: Self-pay | Admitting: Physician Assistant

## 2021-10-07 ENCOUNTER — Ambulatory Visit: Payer: Medicare PPO | Admitting: Physician Assistant

## 2021-10-07 DIAGNOSIS — I10 Essential (primary) hypertension: Secondary | ICD-10-CM | POA: Diagnosis not present

## 2021-10-07 DIAGNOSIS — Z125 Encounter for screening for malignant neoplasm of prostate: Secondary | ICD-10-CM | POA: Diagnosis not present

## 2021-10-07 DIAGNOSIS — R5383 Other fatigue: Secondary | ICD-10-CM | POA: Diagnosis not present

## 2021-10-07 DIAGNOSIS — E559 Vitamin D deficiency, unspecified: Secondary | ICD-10-CM | POA: Diagnosis not present

## 2021-10-07 DIAGNOSIS — M25551 Pain in right hip: Secondary | ICD-10-CM

## 2021-10-07 DIAGNOSIS — R7989 Other specified abnormal findings of blood chemistry: Secondary | ICD-10-CM | POA: Diagnosis not present

## 2021-10-07 DIAGNOSIS — E785 Hyperlipidemia, unspecified: Secondary | ICD-10-CM | POA: Diagnosis not present

## 2021-10-07 DIAGNOSIS — R946 Abnormal results of thyroid function studies: Secondary | ICD-10-CM | POA: Diagnosis not present

## 2021-10-07 MED ORDER — METHYLPREDNISOLONE ACETATE 40 MG/ML IJ SUSP
80.0000 mg | INTRAMUSCULAR | Status: AC | PRN
  Administered 2021-10-07: 80 mg via INTRA_ARTICULAR

## 2021-10-07 MED ORDER — BUPIVACAINE HCL 0.25 % IJ SOLN
2.0000 mL | INTRAMUSCULAR | Status: AC | PRN
  Administered 2021-10-07: 2 mL via INTRA_ARTICULAR

## 2021-10-07 MED ORDER — LIDOCAINE HCL 1 % IJ SOLN
2.0000 mL | INTRAMUSCULAR | Status: AC | PRN
  Administered 2021-10-07: 2 mL

## 2021-10-07 NOTE — Progress Notes (Signed)
Office Visit Note   Patient: Jesus Wagner           Date of Birth: 28-Oct-1935           MRN: [id number] Visit Date: 10/07/2021              Requested by: Creola Corn, MD [address] [place],  [place] [address] PCP: Creola Corn, MD  Chief Complaint  Patient presents with  . Right Hip - Follow-up      HPI: Patient is a pleasant 86 year old gentleman who is a patient of Dr. Eliberto Ivory.  He presents today with a chief complaint of right lateral hip pain.  He denies any injuries and is status post left total hip replacement which is doing well.  He saw Dr. Magnus Ivan for this same pain in April but it was intermittent.  He says he now has it constantly.  He first noticed it when he was trying to sleep on that side at night.  Now he especially feels it when he is getting in and out of the car.  He thinks this is how his hip on the left side began to deteriorate  Assessment & Plan: Visit Diagnoses:  1. Pain in right hip     Plan: I discussed with the patient that we could try a trochanteric bursa injection today.  He thinks that Dr. Magnus Ivan did this on the other side.  If he gets relief from this he can follow-up with Dr. Magnus Ivan as needed if he does not get relief after 5 days or so he is to contact us and we will arrange for him to get an intra-articular injection with Dr. Alvester Morin as he does have a mixed presentation of trochanteric bursitis and some reproducible pain in the groin with external rotation  Follow-Up Instructions: If fails to improve  Ortho Exam  Patient is alert, oriented, no adenopathy, well-dressed, normal affect, normal respiratory effort. Examination of his right hip he has no redness no erythema.  He is tender over the trochanteric bursa.  No radiation of symptoms down the leg.  With rotation of the hip he does have some groin pain reproducible.  He also is somewhat stiff and does not have good fluid motion.  Imaging: XR Pelvis 1-2 Views  Result Date:  10/07/2021 AP pelvis right hip was compared to previous x-rays in April no changes no evidence of fracture does have some sclerotic changes of the hip joint.  No images are attached to the encounter.  Labs: No results found for: "HGBA1C", "ESRSEDRATE", "CRP", "LABURIC", "REPTSTATUS", "GRAMSTAIN", "CULT", "LABORGA"   Lab Results  Component Value Date   ALBUMIN 4.5 04/18/2019   ALBUMIN 3.3 (L) 07/10/2018   ALBUMIN 4.6 07/09/2018    No results found for: "MG" No results found for: "VD25OH"  No results found for: "PREALBUMIN"    Latest Ref Rng & Units 04/19/2019    3:28 AM 04/18/2019    1:32 PM 07/09/2018    3:47 AM  CBC EXTENDED  WBC 4.0 - 10.5 K/uL 8.9  10.9  7.8   RBC 4.22 - 5.81 MIL/uL 4.36  4.56  4.42   Hemoglobin 13.0 - 17.0 g/dL 76.2  83.1  51.7   HCT 39.0 - 52.0 % 40.4  41.4  40.9   Platelets 150 - 400 K/uL 141  137  151   NEUT# 1.7 - 7.7 K/uL  9.7  6.7   Lymph# 0.7 - 4.0 K/uL  0.8  0.8  There is no height or weight on file to calculate BMI.  Orders:  Orders Placed This Encounter  Procedures  . XR Pelvis 1-2 Views   No orders of the defined types were placed in this encounter.    Procedures: Large Joint Inj: R greater trochanter on 10/07/2021 3:02 PM Indications: pain and diagnostic evaluation Details: 25 G 1.5 in needle, lateral approach  Arthrogram: No  Medications: 2 mL lidocaine 1 %; 80 mg methylPREDNISolone acetate 40 MG/ML; 2 mL bupivacaine 0.25 % Outcome: tolerated well, no immediate complications Procedure, treatment alternatives, risks and benefits explained, specific risks discussed. Consent was given by the patient.    Clinical Data: No additional findings.  ROS:  All other systems negative, except as noted in the HPI. Review of Systems  Objective: Vital Signs: There were no vitals taken for this visit.  Specialty Comments:  No specialty comments available.  PMFS History: Patient Active Problem List   Diagnosis Date Noted  .  Acute appendicitis 04/18/2019  . S/P laparoscopic cholecystectomy 07/09/2018  . Arthritis of left hip 08/05/2013  . Status post THR (total hip replacement) 08/05/2013  . Varicose veins of lower extremities with other complications 03/31/2011   Past Medical History:  Diagnosis Date  . Hypertension   . Prostate enlargement     History reviewed. No pertinent family history.  Past Surgical History:  Procedure Laterality Date  . CHOLECYSTECTOMY N/A 07/09/2018   Procedure: LAPAROSCOPIC CHOLECYSTECTOMY;  Surgeon: Emelia Loron, MD;  Location: [facility];  Service: General;  Laterality: N/A;  . COLONOSCOPY     6'9629  . HERNIA REPAIR     inguinal  . LAPAROSCOPIC APPENDECTOMY N/A 04/18/2019   Procedure: APPENDECTOMY LAPAROSCOPIC;  Surgeon: Karie Soda, MD;  Location: [facility];  Service: General;  Laterality: N/A;  . laser varicose vein    . TOTAL HIP ARTHROPLASTY Left 08/05/2013   Procedure: LEFT TOTAL HIP ARTHROPLASTY ANTERIOR APPROACH;  Surgeon: Kathryne Hitch, MD;  Location: [facility];  Service: Orthopedics;  Laterality: Left;  Marland Kitchen VASECTOMY     Social History   Occupational History  . Not on file  Tobacco Use  . Smoking status: Never  . Smokeless tobacco: Never  Substance and Sexual Activity  . Alcohol use: No  . Drug use: No  . Sexual activity: Not on file

## 2021-10-09 DIAGNOSIS — R946 Abnormal results of thyroid function studies: Secondary | ICD-10-CM | POA: Diagnosis not present

## 2021-10-09 DIAGNOSIS — R5383 Other fatigue: Secondary | ICD-10-CM | POA: Diagnosis not present

## 2021-10-09 DIAGNOSIS — E785 Hyperlipidemia, unspecified: Secondary | ICD-10-CM | POA: Diagnosis not present

## 2021-10-14 ENCOUNTER — Telehealth: Payer: Self-pay | Admitting: Orthopaedic Surgery

## 2021-10-14 DIAGNOSIS — M810 Age-related osteoporosis without current pathological fracture: Secondary | ICD-10-CM | POA: Diagnosis not present

## 2021-10-14 DIAGNOSIS — L309 Dermatitis, unspecified: Secondary | ICD-10-CM | POA: Diagnosis not present

## 2021-10-14 DIAGNOSIS — I10 Essential (primary) hypertension: Secondary | ICD-10-CM | POA: Diagnosis not present

## 2021-10-14 DIAGNOSIS — E785 Hyperlipidemia, unspecified: Secondary | ICD-10-CM | POA: Diagnosis not present

## 2021-10-14 DIAGNOSIS — N401 Enlarged prostate with lower urinary tract symptoms: Secondary | ICD-10-CM | POA: Diagnosis not present

## 2021-10-14 DIAGNOSIS — Z0000 Encounter for general adult medical examination without abnormal findings: Secondary | ICD-10-CM | POA: Diagnosis not present

## 2021-10-14 DIAGNOSIS — G3184 Mild cognitive impairment, so stated: Secondary | ICD-10-CM | POA: Diagnosis not present

## 2021-10-14 DIAGNOSIS — R82998 Other abnormal findings in urine: Secondary | ICD-10-CM | POA: Diagnosis not present

## 2021-10-14 DIAGNOSIS — I878 Other specified disorders of veins: Secondary | ICD-10-CM | POA: Diagnosis not present

## 2021-10-14 DIAGNOSIS — J984 Other disorders of lung: Secondary | ICD-10-CM | POA: Diagnosis not present

## 2021-10-14 NOTE — Telephone Encounter (Signed)
Patient called advised his hip is still hurting pretty bad and asked if he can be worked into Dr. Eliberto Ivory of [place]'s schedule? Patient asked if he can get a call back as soon as possible? The number to contact patient is [phone]

## 2021-10-15 ENCOUNTER — Telehealth: Payer: Self-pay | Admitting: Orthopaedic Surgery

## 2021-10-15 NOTE — Telephone Encounter (Signed)
Called patient left message to return call to schedule an appointment for his hip with Dr. Magnus Ivan tomorrow per Morrie Sheldon.

## 2021-10-16 ENCOUNTER — Encounter: Payer: Self-pay | Admitting: Orthopaedic Surgery

## 2021-10-16 ENCOUNTER — Ambulatory Visit: Payer: Medicare PPO | Admitting: Orthopaedic Surgery

## 2021-10-16 DIAGNOSIS — M25551 Pain in right hip: Secondary | ICD-10-CM | POA: Diagnosis not present

## 2021-10-16 NOTE — Progress Notes (Signed)
Office Visit Note   Patient: Jesus Wagner           Date of Birth: [birth year]           MRN: [id number] Visit Date: 10/16/2021              Requested by: Creola Corn, MD [address] [place],  [place] [address] PCP: Creola Corn, MD   Assessment & Plan: Visit Diagnoses:  1. Pain in right hip     Plan: We will have him undergo MRI of his right hip to evaluate for internal derangement/evaluation of cartilage.  Have him back after the MRI to go over results discuss further treatment.  Questions encouraged and answered by Dr. Raye Sorrow itself.  Follow-Up Instructions: Return for After MRI.   Orders:  No orders of the defined types were placed in this encounter.  No orders of the defined types were placed in this encounter.     Procedures: No procedures performed   Clinical Data: No additional findings.   Subjective: Chief Complaint  Patient presents with   Right Hip - Pain    HPI Mr. Husted comes in today for right hip pain.  He was seen on 07/11/2021 by Dr. Raye Sorrow at time radiographs were obtained of his right hip and showed well-preserved right hip.  He has a history of left total hip arthroplasty is doing well.  Again seen on 10/07/2021 and was given a trochanteric injection states his pain got worse after the injection.  He notes that he can barely walk yesterday.  He is not having groin pain.  He states pain occasionally goes past his knee no back pain.  Also continues to have lateral hip pain and some medial thigh pain.  Notes he feels some numbness about the thigh and his hips.  He states that hip feels warm.  Review of Systems See HPI otherwise negative  Objective: Vital Signs: There were no vitals taken for this visit.  Physical Exam Constitutional:      Appearance: He is normal weight. He is not ill-appearing or diaphoretic.  Neurological:     Mental Status: He is alert and oriented to person, place, and time.  Psychiatric:        Mood and Affect: Mood  normal.     Ortho Exam Left hip excellent range of motion without pain.  Right hip he has pain with internal rotation.  Circumflexion of the right hip causes pain.  No tenderness over the right hip trochanteric region on exam today.  Ambulates without any assistive device. Specialty Comments:  No specialty comments available.  Imaging: No results found.   PMFS History: Patient Active Problem List   Diagnosis Date Noted   Acute appendicitis 04/18/2019   S/P laparoscopic cholecystectomy 07/09/2018   Arthritis of left hip 08/05/2013   Status post THR (total hip replacement) 08/05/2013   Varicose veins of lower extremities with other complications 03/31/2011   Past Medical History:  Diagnosis Date   Hypertension    Prostate enlargement     No family history on file.  Past Surgical History:  Procedure Laterality Date   CHOLECYSTECTOMY N/A 07/09/2018   Procedure: LAPAROSCOPIC CHOLECYSTECTOMY;  Surgeon: Emelia Loron, MD;  Location: [facility];  Service: General;  Laterality: N/A;   COLONOSCOPY     7'0623   HERNIA REPAIR     inguinal   LAPAROSCOPIC APPENDECTOMY N/A 04/18/2019   Procedure: APPENDECTOMY LAPAROSCOPIC;  Surgeon: Karie Soda, MD;  Location: [facility];  Service: General;  Laterality: N/A;   laser varicose vein     TOTAL HIP ARTHROPLASTY Left 08/05/2013   Procedure: LEFT TOTAL HIP ARTHROPLASTY ANTERIOR APPROACH;  Surgeon: Kathryne Hitch, MD;  Location: [facility];  Service: Orthopedics;  Laterality: Left;   VASECTOMY     Social History   Occupational History   Not on file  Tobacco Use   Smoking status: Never   Smokeless tobacco: Never  Substance and Sexual Activity   Alcohol use: No   Drug use: No   Sexual activity: Not on file

## 2021-10-17 ENCOUNTER — Other Ambulatory Visit: Payer: Self-pay

## 2021-10-17 DIAGNOSIS — M25551 Pain in right hip: Secondary | ICD-10-CM

## 2021-10-22 ENCOUNTER — Telehealth: Payer: Self-pay | Admitting: Orthopaedic Surgery

## 2021-10-22 ENCOUNTER — Other Ambulatory Visit: Payer: Self-pay | Admitting: Orthopaedic Surgery

## 2021-10-22 MED ORDER — TRAMADOL HCL 50 MG PO TABS: 50.0000 mg | ORAL_TABLET | Freq: Four times a day (QID) | ORAL | 0 refills | Status: DC | PRN

## 2021-10-22 MED ORDER — ACETAMINOPHEN-CODEINE 300-30 MG PO TABS: 1.0000 | ORAL_TABLET | Freq: Three times a day (TID) | ORAL | 0 refills | Status: DC | PRN

## 2021-10-22 NOTE — Telephone Encounter (Signed)
Patient called in asking if we can prescribe some pain Medication Tramadol for the pain being he has to walk to work and he like something to take between shots but no tylenol or Asprin

## 2021-10-22 NOTE — Telephone Encounter (Signed)
Wife states Tramadol never really worked for him Requesting something different?

## 2021-10-22 NOTE — Telephone Encounter (Signed)
Patient called stating that he has some oxycodone and other pain meds that have expired back in 2015 and was wondering if they would still be effective, he's having a lot of pain in his hip and would like to be able to take some pain medicine before his MRI next week. CB # [phone]

## 2021-10-23 NOTE — Telephone Encounter (Signed)
Patient aware this was called in for him  

## 2021-10-25 ENCOUNTER — Telehealth (HOSPITAL_BASED_OUTPATIENT_CLINIC_OR_DEPARTMENT_OTHER): Payer: Self-pay | Admitting: Orthopaedic Surgery

## 2021-10-25 NOTE — Telephone Encounter (Signed)
Dr.Blackman is out of the office until then. Unless wants to see Bronson Curb we wont have anything sooner. Lvm for pt to cb to advise

## 2021-10-25 NOTE — Telephone Encounter (Signed)
Patient called. He would like an MRI review before 8/16. His call back number is [phone]

## 2021-10-29 ENCOUNTER — Ambulatory Visit
Admission: RE | Admit: 2021-10-29 | Discharge: 2021-10-29 | Disposition: A | Discharge status: Home / Self Care | Payer: Medicare PPO | Source: Ambulatory Visit | Attending: Orthopaedic Surgery | Admitting: Orthopaedic Surgery

## 2021-10-29 DIAGNOSIS — M25551 Pain in right hip: Secondary | ICD-10-CM | POA: Diagnosis not present

## 2021-10-30 ENCOUNTER — Telehealth: Payer: Self-pay

## 2021-10-30 ENCOUNTER — Other Ambulatory Visit: Payer: Self-pay | Admitting: Physician Assistant

## 2021-10-30 MED ORDER — HYDROCODONE-ACETAMINOPHEN 5-325 MG PO TABS: 1.0000 | ORAL_TABLET | Freq: Four times a day (QID) | ORAL | 0 refills | Status: DC | PRN

## 2021-10-30 NOTE — Telephone Encounter (Signed)
FYI-  MRI results for right hip are in patient's chart.  Please advise.  Thank you.

## 2021-10-31 ENCOUNTER — Telehealth: Payer: Self-pay | Admitting: Physician Assistant

## 2021-10-31 DIAGNOSIS — M1611 Unilateral primary osteoarthritis, right hip: Secondary | ICD-10-CM

## 2021-10-31 NOTE — Telephone Encounter (Signed)
Patient called with results of MRI scan right hip on 10/30/2021.  MRI shows a nondisplaced nondisplaced subchondral insufficiency fracture involving the right femoral head.  There is significant bone marrow edema of the femoral head.  Moderate to severe arthritic changes involving the femoral head and acetabulum with areas of full-thickness cartilage loss.  Large hip effusion. Patient currently having significant pain and asking for pain medication states he is having difficulty walking at this point in time.  Did provide him with some hydrocodone.  Asked that he use a cane or walker when up ambulating and try to maintain 50% weightbearing on the right hip.  Discussed with him the need for a right total hip arthroplasty in the near future.  He is agreeable.  He has a history of left total hip arthroplasty which is doing well.  He denies any recent chest pain shortness of breath fevers chills or ongoing infection.  Risk benefits of surgery discussed with him he understands the risks.  Risks include but are not limited to DVT/PE, blood loss, wound healing problems, prolonged pain worsening, pain nerve vessel injury and leg discrepancy.  Will proceed with setting him up for right total hip arthroplasty in the very near future.  Have him follow-up with [place] 2 weeks postop.

## 2021-11-04 ENCOUNTER — Telehealth: Payer: Self-pay | Admitting: Orthopaedic Surgery

## 2021-11-04 NOTE — Telephone Encounter (Signed)
Pt just called back and states he received a call and had just missed it. Wondering if it was [facility].

## 2021-11-04 NOTE — Telephone Encounter (Signed)
I spoke with patient and scheduled surgery for 11/15/21.

## 2021-11-04 NOTE — Telephone Encounter (Signed)
Pt called and wants to talk with [facility] about his upcoming appt.   Cb [phone]

## 2021-11-05 NOTE — Telephone Encounter (Signed)
This was handled yesterday.

## 2021-11-06 ENCOUNTER — Ambulatory Visit: Payer: Medicare PPO | Admitting: Orthopaedic Surgery

## 2021-11-06 ENCOUNTER — Other Ambulatory Visit: Payer: Self-pay | Admitting: Physician Assistant

## 2021-11-06 DIAGNOSIS — M1611 Unilateral primary osteoarthritis, right hip: Secondary | ICD-10-CM

## 2021-11-08 NOTE — Patient Instructions (Signed)
DUE TO SPACE LIMITATIONS, ONLY TWO VISITORS  (aged 84 and older) ARE ALLOWED TO COME WITH YOU AND STAY IN THE WAITING ROOM DURING YOUR PRE OP AND PROCEDURE.   **NO VISITORS ARE ALLOWED IN THE SHORT STAY AREA OR RECOVERY ROOM!!**  IF YOU WILL BE ADMITTED INTO THE HOSPITAL YOU ARE ALLOWED ONLY FOUR SUPPORT PEOPLE DURING VISITATION HOURS (7 AM -8PM)   The support person(s) must pass our screening, and use Hand sanitizing gel. Visitors GUEST BADGE MUST BE WORN VISIBLY  One adult visitor may remain with you overnight and MUST be in the room by 8 P.M.   You are not required to quarantine at this time prior to your surgery. However, you must do this: Hand Hygiene often Do NOT share personal items Notify your provider if you are in close contact with someone who has COVID or you develop fever 100.4 or greater, new onset of sneezing, cough, sore throat, shortness of breath or body aches.       Your procedure is scheduled on:  Friday November 15, 2021  Report to [facility] Main Entrance.  Report to admitting at:  12;30 pm  +++++Call this number if you have any questions or problems the morning of surgery [phone]  Do not eat food :After Midnight the night prior to your surgery/procedure.  After Midnight you may have the following liquids until   12:00 noon DAY OF SURGERY  Clear Liquid Diet Water Black Coffee (sugar ok, NO MILK/CREAM OR CREAMERS)  Tea (sugar ok, NO MILK/CREAM OR CREAMERS) regular and decaf                             Plain Jell-O (NO RED)                                           Fruit ices (not with fruit pulp, NO RED)                                     Popsicles (NO RED)                                                                  Juice: apple, WHITE grape, WHITE cranberry Sports drinks like Gatorade (NO RED)                   The day of surgery:  Drink ONE (1) Pre-Surgery Clear Ensure at  12:00 noon the day of surgery. Drink in one sitting. Do not  sip.  This drink was given to you during your hospital pre-op appointment visit. Nothing else to drink after completing the Pre-Surgery Clear Ensure .    FOLLOW ANY ADDITIONAL PRE OP INSTRUCTIONS YOU RECEIVED FROM YOUR SURGEON'S OFFICE!!!   Oral Hygiene is also important to reduce your risk of infection.        Remember - BRUSH YOUR TEETH THE MORNING OF SURGERY WITH YOUR REGULAR TOOTHPASTE  Take ONLY these medicines the morning of surgery with A SIP OF WATER: Amlodipine, if  needed you may take Tylenol or Hydrocodone for pain.  Bring CPAP mask and tubing day of surgery.                   You may not have any metal on your body including  jewelry, and body piercing  Do not wear lotions, powders, cologne, or deodorant  Men may shave face and neck.  Contacts, Hearing Aids, dentures or bridgework may not be worn into surgery.   You may bring a small overnight bag with you on the day of surgery, only pack items that are not valuable .[facility] IS NOT RESPONSIBLE   FOR VALUABLES THAT ARE LOST OR STOLEN.   DO NOT BRING YOUR HOME MEDICATIONS TO THE HOSPITAL. PHARMACY WILL DISPENSE MEDICATIONS LISTED ON YOUR MEDICATION LIST TO YOU DURING YOUR ADMISSION IN THE HOSPITAL!   Special Instructions: Bring a copy of your healthcare power of attorney and living will documents the day of surgery, if you wish to have them scanned into your [facility]- EPIC  Please read over the following fact sheets you were given: IF YOU HAVE QUESTIONS ABOUT YOUR PRE-OP INSTRUCTIONS, PLEASE CALL [phone]  ([name])   [facility] - Preparing for Surgery Before surgery, you can play an important role.  Because skin is not sterile, your skin needs to be as free of germs as possible.  You can reduce the number of germs on your skin by washing with CHG (chlorahexidine gluconate) soap before surgery.  CHG is an antiseptic cleaner which kills germs and bonds with the skin to continue killing germs even after  washing. Please DO NOT use if you have an allergy to CHG or antibacterial soaps.  If your skin becomes reddened/irritated stop using the CHG and inform your nurse when you arrive at Short Stay. Do not shave (including legs and underarms) for at least 48 hours prior to the first CHG shower.  You may shave your face/neck.  Please follow these instructions carefully:  1.  Shower with CHG Soap the night before surgery and the  morning of surgery.  2.  If you choose to wash your hair, wash your hair first as usual with your normal  shampoo.  3.  After you shampoo, rinse your hair and body thoroughly to remove the shampoo.                             4.  Use CHG as you would any other liquid soap.  You can apply chg directly to the skin and wash.  Gently with a scrungie or clean washcloth.  5.  Apply the CHG Soap to your body ONLY FROM THE NECK DOWN.   Do not use on face/ open                           Wound or open sores. Avoid contact with eyes, ears mouth and genitals (private parts).                       Wash face,  Genitals (private parts) with your normal soap.             6.  Wash thoroughly, paying special attention to the area where your  surgery  will be performed.  7.  Thoroughly rinse your body with warm water from the neck down.  8.  DO NOT shower/wash with  your normal soap after using and rinsing off the CHG Soap.            9.  Pat yourself dry with a clean towel.            10.  Wear clean pajamas.            11.  Place clean sheets on your bed the night of your first shower and do not  sleep with pets.  ON THE DAY OF SURGERY : Do not apply any lotions/deodorants the morning of surgery.  Please wear clean clothes to the hospital/surgery center.    FAILURE TO FOLLOW THESE INSTRUCTIONS MAY RESULT IN THE CANCELLATION OF YOUR SURGERY  PATIENT SIGNATURE_________________________________  NURSE  SIGNATURE__________________________________  ________________________________________________________________________   Jesus Wagner    An incentive spirometer is a tool that can help keep your lungs clear and active. This tool measures how well you are filling your lungs with each breath. Taking long deep breaths may help reverse or decrease the chance of developing breathing (pulmonary) problems (especially infection) following: A long period of time when you are unable to move or be active. BEFORE THE PROCEDURE  If the spirometer includes an indicator to show your best effort, your nurse or respiratory therapist will set it to a desired goal. If possible, sit up straight or lean slightly forward. Try not to slouch. Hold the incentive spirometer in an upright position. INSTRUCTIONS FOR USE  Sit on the edge of your bed if possible, or sit up as far as you can in bed or on a chair. Hold the incentive spirometer in an upright position. Breathe out normally. Place the mouthpiece in your mouth and seal your lips tightly around it. Breathe in slowly and as deeply as possible, raising the piston or the ball toward the top of the column. Hold your breath for 3-5 seconds or for as long as possible. Allow the piston or ball to fall to the bottom of the column. Remove the mouthpiece from your mouth and breathe out normally. Rest for a few seconds and repeat Steps 1 through 7 at least 10 times every 1-2 hours when you are awake. Take your time and take a few normal breaths between deep breaths. The spirometer may include an indicator to show your best effort. Use the indicator as a goal to work toward during each repetition. After each set of 10 deep breaths, practice coughing to be sure your lungs are clear. If you have an incision (the cut made at the time of surgery), support your incision when coughing by placing a pillow or rolled up towels firmly against it. Once you are able to get out  of bed, walk around indoors and cough well. You may stop using the incentive spirometer when instructed by your caregiver.  RISKS AND COMPLICATIONS Take your time so you do not get dizzy or light-headed. If you are in pain, you may need to take or ask for pain medication before doing incentive spirometry. It is harder to take a deep breath if you are having pain. AFTER USE Rest and breathe slowly and easily. It can be helpful to keep track of a log of your progress. Your caregiver can provide you with a simple table to help with this. If you are using the spirometer at home, follow these instructions: SEEK MEDICAL CARE IF:  You are having difficultly using the spirometer. You have trouble using the spirometer as often as instructed. Your pain medication is not  giving enough relief while using the spirometer. You develop fever of 100.5 F (38.1 C) or higher.                                                                                                    SEEK IMMEDIATE MEDICAL CARE IF:  You cough up bloody sputum that had not been present before. You develop fever of 102 F (38.9 C) or greater. You develop worsening pain at or near the incision site. MAKE SURE YOU:  Understand these instructions. Will watch your condition. Will get help right away if you are not doing well or get worse. Document Released: 07/21/2006 Document Revised: 06/02/2011 Document Reviewed: 09/21/2006 [facility] Patient Information 2014 [place], [place].

## 2021-11-08 NOTE — Progress Notes (Addendum)
COVID Vaccine received:  []  No [x]  Yes Date of any COVID positive Test in last 90 days:  no  PCP - , MD Cardiologist - none  Chest x-ray - 2015 Epic EKG -  will be done at PST 11-11-2021 Stress Test - n/a ECHO - n/a Cardiac Cath - n/a  Pacemaker/ICD device     [x]  N/A Spinal Cord Stimulator:[x]  No []  Yes   Other Implants:   History of Sleep Apnea? [x]  No []  Yes   Sleep Study Date:   CPAP used?- [x]  No []  Yes  (Instruct to bring their mask & Tubing)  Does the patient monitor blood sugar? []  No []  Yes  [x]  N/A   ERAS Protocol Ordered: []  No  [x]  Yes PRE-SURGERY [x]  ENSURE  []  G2   Comments: Mild cognitive impairment  Activity level: Patient can not climb a flight of stairs without difficulty;  [x]  No CP  [x]  No SOB,  but would have __hip pain_   Anesthesia review:  HTN  Patient denies shortness of breath, fever, cough and chest pain at PAT appointment.  Patient verbalized understanding and agreement to the Pre-Surgical Instructions that were given to them at this PAT appointment. Patient was also educated of the need to review these PAT instructions again prior to his/her surgery.I reviewed the appropriate phone numbers to call if they have any and questions or concerns.

## 2021-11-11 ENCOUNTER — Encounter (HOSPITAL_COMMUNITY)
Admission: RE | Admit: 2021-11-11 | Discharge: 2021-11-11 | Disposition: A | Discharge status: Home / Self Care | Payer: Medicare PPO | Source: Ambulatory Visit | Attending: Orthopaedic Surgery | Admitting: Orthopaedic Surgery

## 2021-11-11 ENCOUNTER — Encounter (HOSPITAL_COMMUNITY): Payer: Self-pay

## 2021-11-11 ENCOUNTER — Other Ambulatory Visit: Payer: Self-pay

## 2021-11-11 VITALS — BP 157/78 | HR 67 | Temp 98.6°F | Resp 16 | Ht 72.0 in | Wt 192.0 lb

## 2021-11-11 DIAGNOSIS — I10 Essential (primary) hypertension: Secondary | ICD-10-CM | POA: Diagnosis not present

## 2021-11-11 DIAGNOSIS — Z01818 Encounter for other preprocedural examination: Secondary | ICD-10-CM | POA: Diagnosis not present

## 2021-11-11 DIAGNOSIS — M1611 Unilateral primary osteoarthritis, right hip: Secondary | ICD-10-CM

## 2021-11-11 HISTORY — DX: Unspecified osteoarthritis, unspecified site: M19.90

## 2021-11-11 LAB — CBC
HCT: 41.6 % (ref 39.0–52.0)
Hemoglobin: 15 g/dL (ref 13.0–17.0)
MCH: 33.5 pg (ref 26.0–34.0)
MCHC: 36.1 g/dL — ABNORMAL HIGH (ref 30.0–36.0)
MCV: 92.9 fL (ref 80.0–100.0)
Platelets: 171 10*3/uL (ref 150–400)
RBC: 4.48 MIL/uL (ref 4.22–5.81)
RDW: 12.1 % (ref 11.5–15.5)
WBC: 5.7 10*3/uL (ref 4.0–10.5)
nRBC: 0 % (ref 0.0–0.2)

## 2021-11-11 LAB — BASIC METABOLIC PANEL
Anion gap: 5 (ref 5–15)
BUN: 12 mg/dL (ref 8–23)
CO2: 27 mmol/L (ref 22–32)
Calcium: 9.1 mg/dL (ref 8.9–10.3)
Chloride: 103 mmol/L (ref 98–111)
Creatinine, Ser: 0.77 mg/dL (ref 0.61–1.24)
GFR, Estimated: >60 mL/min (ref >60)
Glucose, Bld: 97 mg/dL (ref 70–99)
Potassium: 4.5 mmol/L (ref 3.5–5.1)
Sodium: 135 mmol/L (ref 135–145)

## 2021-11-11 LAB — SURGICAL PCR SCREEN
MRSA, PCR: NEGATIVE
Staphylococcus aureus: NEGATIVE

## 2021-11-14 ENCOUNTER — Telehealth: Payer: Self-pay | Admitting: *Deleted

## 2021-11-14 DIAGNOSIS — M1611 Unilateral primary osteoarthritis, right hip: Secondary | ICD-10-CM

## 2021-11-14 NOTE — H&P (Signed)
TOTAL HIP ADMISSION H&P  Patient is admitted for right total hip arthroplasty.  Subjective:  Chief Complaint: right hip pain  HPI: Jesus Wagner, 86 y.o. male, has a history of pain and functional disability in the right hip(s) due to arthritis and patient has failed non-surgical conservative treatments for greater than 12 weeks to include NSAID's and/or analgesics, corticosteriod injections, and activity modification.  Onset of symptoms was abrupt starting just a few months ago with rapidlly worsening course since that time.The patient noted no past surgery on the right hip(s).  Patient currently rates pain in the right hip at 10 out of 10 with activity. Patient has night pain, worsening of pain with activity and weight bearing, pain that interfers with activities of daily living, and pain with passive range of motion. Patient has evidence of  significant arthritis with cartilage loss and subchondral edema as well as a stress fracture  by imaging studies. This condition presents safety issues increasing the risk of falls.  There is no current active infection.  Patient Active Problem List   Diagnosis Date Noted   Arthritis of right hip 11/14/2021   Acute appendicitis 04/18/2019   S/P laparoscopic cholecystectomy 07/09/2018   Arthritis of left hip 08/05/2013   Status post THR (total hip replacement) 08/05/2013   Varicose veins of lower extremities with other complications 03/31/2011   Past Medical History:  Diagnosis Date   Arthritis    Hypertension    Prostate enlargement     Past Surgical History:  Procedure Laterality Date   CHOLECYSTECTOMY N/A 07/09/2018   Procedure: LAPAROSCOPIC CHOLECYSTECTOMY;  Surgeon: Emelia Loron, MD;  Location: [facility];  Service: General;  Laterality: N/A;   COLONOSCOPY     1'0175   HERNIA REPAIR     inguinal   LAPAROSCOPIC APPENDECTOMY N/A 04/18/2019   Procedure: APPENDECTOMY LAPAROSCOPIC;  Surgeon: Karie Soda, MD;  Location: [facility];  Service:  General;  Laterality: N/A;   laser varicose vein     TOTAL HIP ARTHROPLASTY Left 08/05/2013   Procedure: LEFT TOTAL HIP ARTHROPLASTY ANTERIOR APPROACH;  Surgeon: Kathryne Hitch, MD;  Location: [facility];  Service: Orthopedics;  Laterality: Left;   VASECTOMY      No current facility-administered medications for this encounter.   Current Outpatient Medications  Medication Sig Dispense Refill Last Dose   alum & mag hydroxide-simeth (MAALOX/MYLANTA) 200-200-20 MG/5ML suspension Take 15 mLs by mouth every 6 (six) hours as needed for indigestion or heartburn.      amLODipine (NORVASC) 2.5 MG tablet Take 2.5 mg by mouth daily.      cholecalciferol (VITAMIN D) 1000 UNITS tablet Take 2,000 Units by mouth daily.       diclofenac Sodium (VOLTAREN) 1 % GEL Apply 1 Application topically 4 (four) times daily as needed (pain).      docusate sodium (COLACE) 100 MG capsule Take 100-200 mg by mouth See admin instructions. Take 200 mg in the morning and 100 mg midday      HYDROcodone-acetaminophen (NORCO) 5-325 MG tablet Take 1 tablet by mouth every 6 (six) hours as needed for moderate pain. 30 tablet 0    linaclotide (LINZESS) 145 MCG CAPS capsule Take 145 mcg by mouth daily as needed (constipation).      losartan (COZAAR) 100 MG tablet Take 100 mg by mouth daily.      rosuvastatin (CRESTOR) 10 MG tablet Take 10 mg by mouth daily.       Simethicone (GAS RELIEF PO) Take 2 tablets by mouth daily  as needed (gas).      acetaminophen (TYLENOL) 500 MG tablet Take 2 tablets (1,000 mg total) by mouth every 6 (six) hours. (Patient taking differently: Take 500-1,000 mg by mouth every 6 (six) hours as needed for moderate pain.) 30 tablet 0    Allergies  Allergen Reactions   Alendronate     hurting   Aspirin     Stomach upset    Codeine Nausea Only    Social History   Tobacco Use   Smoking status: Never   Smokeless tobacco: Never  Substance Use Topics   Alcohol use: No    No family history on file.    Review of Systems  All other systems reviewed and are negative.   Objective:  Physical Exam Vitals reviewed.  Constitutional:      Appearance: Normal appearance.  HENT:     Head: Normocephalic and atraumatic.  Eyes:     Extraocular Movements: Extraocular movements intact.     Pupils: Pupils are equal, round, and reactive to light.  Cardiovascular:     Rate and Rhythm: Normal rate.  Pulmonary:     Effort: Pulmonary effort is normal.     Breath sounds: Normal breath sounds.  Abdominal:     Palpations: Abdomen is soft.  Musculoskeletal:     Cervical back: Normal range of motion and neck supple.     Right hip: Tenderness and bony tenderness present. Decreased strength.  Neurological:     Mental Status: He is alert and oriented to person, place, and time.  Psychiatric:        Behavior: Behavior normal.     Vital signs in last 24 hours:    Labs:   Estimated body mass index is 26.04 kg/m as calculated from the following:   Height as of 11/11/21: 6' (1.829 m).   Weight as of 11/11/21: 87.1 kg.   Imaging Review MRI demonstrates severe degenerative joint disease of the right hip(s). The bone quality appears to be good for age and reported activity level.      Assessment/Plan:  End stage arthritis, right hip(s)  The patient history, physical examination, clinical judgement of the provider and imaging studies are consistent with end stage degenerative joint disease of the right hip(s) and total hip arthroplasty is deemed medically necessary. The treatment options including medical management, injection therapy, arthroscopy and arthroplasty were discussed at length. The risks and benefits of total hip arthroplasty were presented and reviewed. The risks due to aseptic loosening, infection, stiffness, dislocation/subluxation,  thromboembolic complications and other imponderables were discussed.  The patient acknowledged the explanation, agreed to proceed with the plan and  consent was signed. Patient is being admitted for inpatient treatment for surgery, pain control, PT, OT, prophylactic antibiotics, VTE prophylaxis, progressive ambulation and ADL's and discharge planning.The patient is planning to be discharged home with home health services

## 2021-11-14 NOTE — Telephone Encounter (Signed)
[facility]tho [facility] pre-op call completed. 

## 2021-11-14 NOTE — Care Plan (Signed)
[facility] RNCM call to patient to discuss his upcoming Right total hip arthroplasty on 11/15/21 with Dr. Magnus Ivan. Patient is an Ortho bundle patient through [facility] and is agreeable to case management. He lives with his spouse, who will be assisting after discharge. He has a RW and 3in1/BSC. No further DME is needed. Anticipate HHPT will be needed after a short hospital stay. Referral made to [facility] after choice provided. Reviewed all post op care instructions. Will continue to follow for needs.

## 2021-11-15 ENCOUNTER — Ambulatory Visit (HOSPITAL_COMMUNITY): Payer: Medicare PPO

## 2021-11-15 ENCOUNTER — Encounter (HOSPITAL_COMMUNITY): Payer: Self-pay | Admitting: Orthopaedic Surgery

## 2021-11-15 ENCOUNTER — Other Ambulatory Visit: Payer: Self-pay

## 2021-11-15 ENCOUNTER — Encounter (HOSPITAL_COMMUNITY)
Admission: RE | Disposition: A | Discharge status: Home Health | Payer: Self-pay | Source: Home / Self Care | Attending: Orthopaedic Surgery

## 2021-11-15 ENCOUNTER — Ambulatory Visit (HOSPITAL_BASED_OUTPATIENT_CLINIC_OR_DEPARTMENT_OTHER): Payer: Medicare PPO | Admitting: Anesthesiology

## 2021-11-15 ENCOUNTER — Ambulatory Visit (HOSPITAL_COMMUNITY): Payer: Medicare PPO | Admitting: Physician Assistant

## 2021-11-15 ENCOUNTER — Observation Stay (HOSPITAL_COMMUNITY)
Admission: RE | Admit: 2021-11-15 | Discharge: 2021-11-18 | Disposition: A | Discharge status: Home Health | Payer: Medicare PPO | Attending: Orthopaedic Surgery | Admitting: Orthopaedic Surgery

## 2021-11-15 DIAGNOSIS — Z96642 Presence of left artificial hip joint: Secondary | ICD-10-CM | POA: Insufficient documentation

## 2021-11-15 DIAGNOSIS — Z79899 Other long term (current) drug therapy: Secondary | ICD-10-CM | POA: Insufficient documentation

## 2021-11-15 DIAGNOSIS — I10 Essential (primary) hypertension: Secondary | ICD-10-CM | POA: Diagnosis not present

## 2021-11-15 DIAGNOSIS — Z96643 Presence of artificial hip joint, bilateral: Secondary | ICD-10-CM | POA: Diagnosis not present

## 2021-11-15 DIAGNOSIS — M1611 Unilateral primary osteoarthritis, right hip: Secondary | ICD-10-CM | POA: Diagnosis not present

## 2021-11-15 DIAGNOSIS — Z471 Aftercare following joint replacement surgery: Secondary | ICD-10-CM | POA: Diagnosis not present

## 2021-11-15 DIAGNOSIS — Z96641 Presence of right artificial hip joint: Secondary | ICD-10-CM | POA: Diagnosis not present

## 2021-11-15 DIAGNOSIS — M25851 Other specified joint disorders, right hip: Secondary | ICD-10-CM | POA: Diagnosis not present

## 2021-11-15 HISTORY — PX: TOTAL HIP ARTHROPLASTY: SHX124

## 2021-11-15 LAB — TYPE AND SCREEN
ABO/RH(D): O POS
Antibody Screen: NEGATIVE

## 2021-11-15 SURGERY — ARTHROPLASTY, HIP, TOTAL, ANTERIOR APPROACH
Anesthesia: Spinal | Site: Hip | Laterality: Right

## 2021-11-15 MED ORDER — BUPIVACAINE IN DEXTROSE 0.75-8.25 % IT SOLN
INTRATHECAL | Status: DC | PRN
  Administered 2021-11-15: 1.8 mL via INTRATHECAL

## 2021-11-15 MED ORDER — SODIUM CHLORIDE 0.9 % IR SOLN
Status: DC | PRN
  Administered 2021-11-15: 1000 mL

## 2021-11-15 MED ORDER — TRANEXAMIC ACID-NACL 1000-0.7 MG/100ML-% IV SOLN
1000.0000 mg | INTRAVENOUS | Status: AC
  Administered 2021-11-15: 1000 mg via INTRAVENOUS
  Filled 2021-11-15: qty 100

## 2021-11-15 MED ORDER — 0.9 % SODIUM CHLORIDE (POUR BTL) OPTIME
TOPICAL | Status: DC | PRN
  Administered 2021-11-15: 1000 mL

## 2021-11-15 MED ORDER — PROPOFOL 1000 MG/100ML IV EMUL
INTRAVENOUS | Status: AC
  Filled 2021-11-15: qty 100

## 2021-11-15 MED ORDER — LOSARTAN POTASSIUM 50 MG PO TABS
100.0000 mg | ORAL_TABLET | Freq: Every day | ORAL | Status: DC
  Administered 2021-11-15 – 2021-11-18 (×4): 100 mg via ORAL
  Filled 2021-11-15 (×4): qty 2

## 2021-11-15 MED ORDER — POLYETHYLENE GLYCOL 3350 17 G PO PACK
17.0000 g | PACK | Freq: Every day | ORAL | Status: DC | PRN
  Administered 2021-11-17: 17 g via ORAL
  Filled 2021-11-15: qty 1

## 2021-11-15 MED ORDER — GABAPENTIN 100 MG PO CAPS
100.0000 mg | ORAL_CAPSULE | Freq: Three times a day (TID) | ORAL | Status: DC
Start: 2021-11-15 — End: 2021-11-18
  Administered 2021-11-15 – 2021-11-18 (×8): 100 mg via ORAL
  Filled 2021-11-15 (×8): qty 1

## 2021-11-15 MED ORDER — DEXAMETHASONE SODIUM PHOSPHATE 10 MG/ML IJ SOLN
INTRAMUSCULAR | Status: AC
  Filled 2021-11-15: qty 1

## 2021-11-15 MED ORDER — ACETAMINOPHEN 500 MG PO TABS: 1000.0000 mg | ORAL_TABLET | Freq: Once | ORAL | Status: AC

## 2021-11-15 MED ORDER — OXYCODONE HCL 5 MG PO TABS
10.0000 mg | ORAL_TABLET | ORAL | Status: DC | PRN
  Administered 2021-11-17: 10 mg via ORAL
  Filled 2021-11-15: qty 3
  Filled 2021-11-15: qty 2

## 2021-11-15 MED ORDER — PROPOFOL 10 MG/ML IV BOLUS
INTRAVENOUS | Status: DC | PRN
  Administered 2021-11-15 (×3): 10 mg via INTRAVENOUS

## 2021-11-15 MED ORDER — METHOCARBAMOL 500 MG IVPB - SIMPLE MED: 500.0000 mg | Freq: Four times a day (QID) | INTRAVENOUS | Status: DC | PRN

## 2021-11-15 MED ORDER — PROPOFOL 10 MG/ML IV BOLUS
INTRAVENOUS | Status: AC
  Filled 2021-11-15: qty 20

## 2021-11-15 MED ORDER — HYDROMORPHONE HCL 1 MG/ML IJ SOLN
0.5000 mg | INTRAMUSCULAR | Status: DC | PRN
  Administered 2021-11-15: 0.5 mg via INTRAVENOUS
  Administered 2021-11-16: 1 mg via INTRAVENOUS
  Filled 2021-11-15 (×2): qty 1

## 2021-11-15 MED ORDER — PHENOL 1.4 % MT LIQD: 1.0000 | OROMUCOSAL | Status: DC | PRN

## 2021-11-15 MED ORDER — ROSUVASTATIN CALCIUM 10 MG PO TABS
10.0000 mg | ORAL_TABLET | Freq: Every day | ORAL | Status: DC
  Administered 2021-11-15 – 2021-11-17 (×3): 10 mg via ORAL
  Filled 2021-11-15 (×3): qty 1

## 2021-11-15 MED ORDER — CHLORHEXIDINE GLUCONATE 0.12 % MT SOLN
15.0000 mL | Freq: Once | OROMUCOSAL | Status: AC
  Administered 2021-11-15: 15 mL via OROMUCOSAL

## 2021-11-15 MED ORDER — ONDANSETRON HCL 4 MG/2ML IJ SOLN
INTRAMUSCULAR | Status: DC | PRN
  Administered 2021-11-15: 4 mg via INTRAVENOUS

## 2021-11-15 MED ORDER — POVIDONE-IODINE 10 % EX SWAB
2.0000 | Freq: Once | CUTANEOUS | Status: AC
  Administered 2021-11-15: 2 via TOPICAL

## 2021-11-15 MED ORDER — METHOCARBAMOL 500 MG IVPB - SIMPLE MED
INTRAVENOUS | Status: AC
  Administered 2021-11-15: 500 mg via INTRAVENOUS
  Filled 2021-11-15: qty 55

## 2021-11-15 MED ORDER — FENTANYL CITRATE PF 50 MCG/ML IJ SOSY: 25.0000 ug | PREFILLED_SYRINGE | INTRAMUSCULAR | Status: DC | PRN

## 2021-11-15 MED ORDER — AMISULPRIDE (ANTIEMETIC) 5 MG/2ML IV SOLN: 10.0000 mg | Freq: Once | INTRAVENOUS | Status: DC | PRN

## 2021-11-15 MED ORDER — OXYCODONE HCL 5 MG PO TABS
ORAL_TABLET | ORAL | Status: AC
  Filled 2021-11-15: qty 1

## 2021-11-15 MED ORDER — METOCLOPRAMIDE HCL 5 MG PO TABS: 5.0000 mg | ORAL_TABLET | Freq: Three times a day (TID) | ORAL | Status: DC | PRN

## 2021-11-15 MED ORDER — ONDANSETRON HCL 4 MG PO TABS: 4.0000 mg | ORAL_TABLET | Freq: Four times a day (QID) | ORAL | Status: DC | PRN

## 2021-11-15 MED ORDER — PANTOPRAZOLE SODIUM 40 MG PO TBEC
40.0000 mg | DELAYED_RELEASE_TABLET | Freq: Every day | ORAL | Status: DC
  Administered 2021-11-15 – 2021-11-18 (×4): 40 mg via ORAL
  Filled 2021-11-15 (×4): qty 1

## 2021-11-15 MED ORDER — FENTANYL CITRATE PF 50 MCG/ML IJ SOSY
PREFILLED_SYRINGE | INTRAMUSCULAR | Status: AC
  Administered 2021-11-15: 50 ug via INTRAVENOUS
  Filled 2021-11-15: qty 1

## 2021-11-15 MED ORDER — ACETAMINOPHEN 500 MG PO TABS
ORAL_TABLET | ORAL | Status: AC
  Administered 2021-11-15: 1000 mg via ORAL
  Filled 2021-11-15: qty 2

## 2021-11-15 MED ORDER — METHOCARBAMOL 500 MG PO TABS
500.0000 mg | ORAL_TABLET | Freq: Four times a day (QID) | ORAL | Status: DC | PRN
  Administered 2021-11-16 – 2021-11-17 (×3): 500 mg via ORAL
  Filled 2021-11-15 (×4): qty 1

## 2021-11-15 MED ORDER — ACETAMINOPHEN 325 MG PO TABS
325.0000 mg | ORAL_TABLET | Freq: Four times a day (QID) | ORAL | Status: DC | PRN
  Administered 2021-11-16 – 2021-11-17 (×2): 650 mg via ORAL
  Filled 2021-11-15 (×2): qty 2

## 2021-11-15 MED ORDER — METOCLOPRAMIDE HCL 5 MG/ML IJ SOLN: 5.0000 mg | Freq: Three times a day (TID) | INTRAMUSCULAR | Status: DC | PRN

## 2021-11-15 MED ORDER — ONDANSETRON HCL 4 MG/2ML IJ SOLN
INTRAMUSCULAR | Status: AC
  Filled 2021-11-15: qty 2

## 2021-11-15 MED ORDER — DIPHENHYDRAMINE HCL 12.5 MG/5ML PO ELIX: 12.5000 mg | ORAL_SOLUTION | ORAL | Status: DC | PRN

## 2021-11-15 MED ORDER — OXYCODONE HCL 5 MG PO TABS
ORAL_TABLET | ORAL | Status: AC
  Administered 2021-11-15: 5 mg via ORAL
  Filled 2021-11-15: qty 1

## 2021-11-15 MED ORDER — CEFAZOLIN SODIUM-DEXTROSE 1-4 GM/50ML-% IV SOLN
1.0000 g | Freq: Four times a day (QID) | INTRAVENOUS | Status: AC
  Administered 2021-11-15 – 2021-11-16 (×2): 1 g via INTRAVENOUS
  Filled 2021-11-15 (×2): qty 50

## 2021-11-15 MED ORDER — OXYCODONE HCL 5 MG PO TABS
5.0000 mg | ORAL_TABLET | ORAL | Status: DC | PRN
  Administered 2021-11-15: 5 mg via ORAL
  Administered 2021-11-15 – 2021-11-16 (×4): 10 mg via ORAL
  Administered 2021-11-16 – 2021-11-17 (×2): 5 mg via ORAL
  Administered 2021-11-17 – 2021-11-18 (×3): 10 mg via ORAL
  Filled 2021-11-15 (×4): qty 2
  Filled 2021-11-15: qty 1
  Filled 2021-11-15: qty 2
  Filled 2021-11-15: qty 1
  Filled 2021-11-15: qty 2

## 2021-11-15 MED ORDER — LACTATED RINGERS IV SOLN: INTRAVENOUS | Status: DC

## 2021-11-15 MED ORDER — MENTHOL 3 MG MT LOZG
1.0000 | LOZENGE | OROMUCOSAL | Status: DC | PRN
Start: 2021-11-15 — End: 2021-11-18

## 2021-11-15 MED ORDER — ONDANSETRON HCL 4 MG/2ML IJ SOLN: 4.0000 mg | Freq: Four times a day (QID) | INTRAMUSCULAR | Status: DC | PRN

## 2021-11-15 MED ORDER — ASPIRIN 81 MG PO CHEW
81.0000 mg | CHEWABLE_TABLET | Freq: Two times a day (BID) | ORAL | Status: DC
  Administered 2021-11-15 – 2021-11-18 (×6): 81 mg via ORAL
  Filled 2021-11-15 (×6): qty 1

## 2021-11-15 MED ORDER — SODIUM CHLORIDE 0.9 % IV SOLN: INTRAVENOUS | Status: DC

## 2021-11-15 MED ORDER — DOCUSATE SODIUM 100 MG PO CAPS
100.0000 mg | ORAL_CAPSULE | Freq: Two times a day (BID) | ORAL | Status: DC
  Administered 2021-11-15 – 2021-11-18 (×6): 100 mg via ORAL
  Filled 2021-11-15 (×6): qty 1

## 2021-11-15 MED ORDER — AMLODIPINE BESYLATE 5 MG PO TABS
2.5000 mg | ORAL_TABLET | Freq: Every day | ORAL | Status: DC
  Administered 2021-11-16 – 2021-11-18 (×3): 2.5 mg via ORAL
  Filled 2021-11-15 (×3): qty 1

## 2021-11-15 MED ORDER — PROPOFOL 500 MG/50ML IV EMUL
INTRAVENOUS | Status: DC | PRN
  Administered 2021-11-15: 75 ug/kg/min via INTRAVENOUS

## 2021-11-15 MED ORDER — ALUM & MAG HYDROXIDE-SIMETH 200-200-20 MG/5ML PO SUSP: 30.0000 mL | ORAL | Status: DC | PRN

## 2021-11-15 MED ORDER — STERILE WATER FOR IRRIGATION IR SOLN
Status: DC | PRN
  Administered 2021-11-15: 2000 mL

## 2021-11-15 MED ORDER — CEFAZOLIN SODIUM-DEXTROSE 2-4 GM/100ML-% IV SOLN
2.0000 g | INTRAVENOUS | Status: AC
  Administered 2021-11-15: 2 g via INTRAVENOUS
  Filled 2021-11-15: qty 100

## 2021-11-15 MED ORDER — DEXAMETHASONE SODIUM PHOSPHATE 10 MG/ML IJ SOLN
INTRAMUSCULAR | Status: DC | PRN
  Administered 2021-11-15: 8 mg via INTRAVENOUS

## 2021-11-15 MED ORDER — VITAMIN D 25 MCG (1000 UNIT) PO TABS
2000.0000 [IU] | ORAL_TABLET | Freq: Every day | ORAL | Status: DC
Start: 2021-11-16 — End: 2021-11-18
  Administered 2021-11-16 – 2021-11-18 (×3): 2000 [IU] via ORAL
  Filled 2021-11-15 (×3): qty 2

## 2021-11-15 MED ORDER — ORAL CARE MOUTH RINSE: 15.0000 mL | Freq: Once | OROMUCOSAL | Status: AC

## 2021-11-15 MED ORDER — PHENYLEPHRINE HCL-NACL 20-0.9 MG/250ML-% IV SOLN
INTRAVENOUS | Status: DC | PRN
  Administered 2021-11-15: 35 ug/min via INTRAVENOUS

## 2021-11-15 SURGICAL SUPPLY — 41 items
ACETAB CUP W/GRIPTION 54 (Plate) ×1 IMPLANT
BAG COUNTER SPONGE SURGICOUNT (BAG) ×1 IMPLANT
BAG ZIPLOCK 12X15 (MISCELLANEOUS) IMPLANT
BENZOIN TINCTURE PRP APPL 2/3 (GAUZE/BANDAGES/DRESSINGS) IMPLANT
BLADE SAW SGTL 18X1.27X75 (BLADE) ×1 IMPLANT
COVER PERINEAL POST (MISCELLANEOUS) ×1 IMPLANT
COVER SURGICAL LIGHT HANDLE (MISCELLANEOUS) ×1 IMPLANT
CUP ACETAB W/GRIPTION 54 (Plate) IMPLANT
DRAPE FOOT SWITCH (DRAPES) ×1 IMPLANT
DRAPE STERI IOBAN 125X83 (DRAPES) ×1 IMPLANT
DRAPE U-SHAPE 47X51 STRL (DRAPES) ×2 IMPLANT
DRSG AQUACEL AG ADV 3.5X10 (GAUZE/BANDAGES/DRESSINGS) ×1 IMPLANT
DURAPREP 26ML APPLICATOR (WOUND CARE) ×1 IMPLANT
ELECT REM PT RETURN 15FT ADLT (MISCELLANEOUS) ×1 IMPLANT
GAUZE XEROFORM 1X8 LF (GAUZE/BANDAGES/DRESSINGS) ×1 IMPLANT
GLOVE BIO SURGEON STRL SZ7.5 (GLOVE) ×1 IMPLANT
GLOVE BIOGEL PI IND STRL 8 (GLOVE) ×2 IMPLANT
GLOVE BIOGEL PI INDICATOR 8 (GLOVE) ×2
GLOVE ECLIPSE 8.0 STRL XLNG CF (GLOVE) ×1 IMPLANT
GOWN STRL REUS W/ TWL XL LVL3 (GOWN DISPOSABLE) ×2 IMPLANT
GOWN STRL REUS W/TWL XL LVL3 (GOWN DISPOSABLE) ×2
HANDPIECE INTERPULSE COAX TIP (DISPOSABLE) ×1
HEAD M SROM 36MM PLUS 1.5 (Hips) IMPLANT
HOLDER FOLEY CATH W/STRAP (MISCELLANEOUS) ×1 IMPLANT
KIT TURNOVER KIT A (KITS) IMPLANT
LINER NEUTRAL 54X36MM PLUS 4 (Hips) IMPLANT
PACK ANTERIOR HIP CUSTOM (KITS) ×1 IMPLANT
SET HNDPC FAN SPRY TIP SCT (DISPOSABLE) ×1 IMPLANT
SROM M HEAD 36MM PLUS 1.5 (Hips) ×1 IMPLANT
STAPLER VISISTAT 35W (STAPLE) IMPLANT
STEM CORAIL KA12 (Stem) IMPLANT
STRIP CLOSURE SKIN 1/2X4 (GAUZE/BANDAGES/DRESSINGS) IMPLANT
SUT ETHIBOND NAB CT1 #1 30IN (SUTURE) ×1 IMPLANT
SUT ETHILON 2 0 PS N (SUTURE) IMPLANT
SUT MNCRL AB 4-0 PS2 18 (SUTURE) IMPLANT
SUT VIC AB 0 CT1 36 (SUTURE) ×1 IMPLANT
SUT VIC AB 1 CT1 36 (SUTURE) ×1 IMPLANT
SUT VIC AB 2-0 CT1 27 (SUTURE) ×2
SUT VIC AB 2-0 CT1 TAPERPNT 27 (SUTURE) ×2 IMPLANT
TRAY FOLEY MTR SLVR 16FR STAT (SET/KITS/TRAYS/PACK) IMPLANT
YANKAUER SUCT BULB TIP NO VENT (SUCTIONS) ×1 IMPLANT

## 2021-11-15 NOTE — Interval H&P Note (Signed)
History and Physical Interval Note: The patient understands he is here today for right hip replacement due to the severe worsening of his right hip pain combined with MRI findings showing significant arthritic changes and subchondral edema with stress fracture in the femoral head.  There has been no acute change in medical status.  See H&P.  The risks and benefits of surgery been explained in detail and informed consent is obtained.  The right operative hip has been marked.  11/15/2021 1:00 PM  Jesus Wagner  has presented today for surgery, with the diagnosis of right hip insufficiency, osteoarthritis.  The various methods of treatment have been discussed with the patient and family. After consideration of risks, benefits and other options for treatment, the patient has consented to  Procedure(s): RIGHT TOTAL HIP ARTHROPLASTY ANTERIOR APPROACH (Right) as a surgical intervention.  The patient's history has been reviewed, patient examined, no change in status, stable for surgery.  I have reviewed the patient's chart and labs.  Questions were answered to the patient's satisfaction.     Kathryne Hitch

## 2021-11-15 NOTE — Anesthesia Procedure Notes (Signed)
Procedure Name: MAC Date/Time: 11/15/2021 2:16 PM  Performed by: Niel Hummer, CRNAPre-anesthesia Checklist: Patient identified, Emergency Drugs available, Suction available and Patient being monitored Oxygen Delivery Method: Simple face mask

## 2021-11-15 NOTE — Anesthesia Postprocedure Evaluation (Signed)
Anesthesia Post Note  Patient: Jesus Wagner  Procedure(s) Performed: RIGHT TOTAL HIP ARTHROPLASTY ANTERIOR APPROACH (Right: Hip)     Patient location during evaluation: PACU Anesthesia Type: Spinal Level of consciousness: awake and alert Pain management: pain level controlled Vital Signs Assessment: post-procedure vital signs reviewed and stable Respiratory status: spontaneous breathing and respiratory function stable Cardiovascular status: blood pressure returned to baseline and stable Postop Assessment: spinal receding Anesthetic complications: no   No notable events documented.  Last Vitals:  Vitals:   11/15/21 1800 11/15/21 1900  BP: (!) 162/72 (!) 167/83  Pulse: 60 82  Resp: 15 17  Temp:  36.5 C  SpO2: 98% 95%    Last Pain:  Vitals:   11/15/21 1900  TempSrc:   PainSc: 3                  Kennieth Rad

## 2021-11-15 NOTE — Anesthesia Preprocedure Evaluation (Signed)
Anesthesia Evaluation  Patient identified by MRN, date of birth, ID band Patient awake    Reviewed: Allergy & Precautions, NPO status , Patient's Chart, lab work & pertinent test results  Airway Mallampati: II  TM Distance: >3 FB Neck ROM: Full    Dental   Pulmonary neg pulmonary ROS,    breath sounds clear to auscultation       Cardiovascular hypertension, Pt. on medications  Rhythm:Regular Rate:Normal     Neuro/Psych negative neurological ROS     GI/Hepatic negative GI ROS, Neg liver ROS,   Endo/Other  negative endocrine ROS  Renal/GU negative Renal ROS     Musculoskeletal  (+) Arthritis ,   Abdominal   Peds  Hematology negative hematology ROS (+)   Anesthesia Other Findings   Reproductive/Obstetrics                             Lab Results  Component Value Date   WBC 5.7 11/11/2021   HGB 15.0 11/11/2021   HCT 41.6 11/11/2021   MCV 92.9 11/11/2021   PLT 171 11/11/2021   Lab Results  Component Value Date   CREATININE 0.77 11/11/2021   BUN 12 11/11/2021   NA 135 11/11/2021   K 4.5 11/11/2021   CL 103 11/11/2021   CO2 27 11/11/2021    Anesthesia Physical Anesthesia Plan  ASA: 2  Anesthesia Plan: Spinal   Post-op Pain Management: Tylenol PO (pre-op)*   Induction:   PONV Risk Score and Plan: 1 and Propofol infusion, Dexamethasone and Ondansetron  Airway Management Planned: Natural Airway and Simple Face Mask  Additional Equipment:   Intra-op Plan:   Post-operative Plan:   Informed Consent: I have reviewed the patients History and Physical, chart, labs and discussed the procedure including the risks, benefits and alternatives for the proposed anesthesia with the patient or authorized representative who has indicated his/her understanding and acceptance.       Plan Discussed with: CRNA  Anesthesia Plan Comments:         Anesthesia Quick Evaluation

## 2021-11-15 NOTE — Op Note (Signed)
Operative Note  Date of operation: 11/15/2021  Preoperative diagnosis: Severe end-stage arthritis right hip Postoperative diagnosis: Same  Procedure: Right direct anterior total hip arthroplasty  Implants: DePuy Sectra GRIPTION acetabular component size 54, size 36+4 polythene liner, size 12 Corail femoral component with standard offset, 36+1.5 metal hip ball  Surgeon: Vanita Panda. Magnus Ivan, MD Assistant: Rexene Edison, PA-C  Anesthesia: Spinal EBL: 150 cc Antibiotics: 2 g IV Ancef Complications: None  Indications: The patient is an 86 year old gentleman well-known to me.  He has had significantly worsening right hip pain in a short amount of time.  His x-rays did not show any significant findings but we did obtain a MRI recently of his right hip showing significant edema in the femoral head and neck with areas of full-thickness cartilage loss.  There is a large joint effusion and evidence of A subchondral stress fracture.  Given the severity of his pain we recommended a total hip arthroplasty.  We actually replaced his left hip in 2015.  He is fully aware of the risk of acute blood loss anemia, nerve or vessel injury, infection, DVT, implant failure, dislocation, and skin and soft tissue issues.  He understands her goals are to decrease pain, improve mobility, and improve quality of life.  Procedure description: After informed consent was obtained and the appropriate right hip was marked, the patient was brought to the operating room and set up on a stretcher where spinal anesthesia was obtained.  He was then laid in supine position on the stretcher and a Foley catheter was placed.  Traction boots were placed on both his feet.  Next he was placed supine on the Hana fracture table with a perineal post in place in both legs in traction devices but no traction applied.  His right operative hip was prepped and draped with DuraPrep and sterile drapes.  A timeout was called and he identified correct  patient and the correct right hip.  An incision was then made just inferior and posterior to the ASIS and carried slightly obliquely down the leg.  Dissection was carried down to the tensor fascia lata muscle and the tensor fascia was then divided longitudinally to proceed with a directed approach to the hip.  Circumflex vessels were identified and cauterized and the joint capsule was identified and opened up and L-type format finding a large joint effusion.  Cobra retractors were placed around the medial and lateral femoral neck and a femoral neck cut was made with an oscillating saw proximal to the lesser trochanter.  This was completed with an osteotome.  A corkscrew guide was placed in the femoral head the femoral head was removed in its entirety and there was evidence of both osteonecrosis and osteoarthritis.  A bent Hohmann was placed in the medial acetabular rim and I remove remnants of the labrum and other debris.  We then began reaming under direct visitation from a size 43 reamer and stepwise increments going up to a size 53 reamer with all reamers placed under direct visualization in the last reamer placed under direct fluoroscopy psych obtain my depth of reaming, my inclination, and anteversion.  I then placed the real DePuy sector GRIPTION acetabular component size 54 and a 36+4 polyethylene liner for that size acetabular opponent.  Attention was then turned to the femur.  With the leg externally rotated, extended and add ducted, a Mueller retractor was placed medially and a Hohmann retractor was placed behind the greater trochanter.  The lateral joint capsule was released.  A box cutting osteotome was used to enter the femoral canal and then I began broaching using the Corail broaching system from a size 8 going to a size 12.  With a size 12 in place we trialed a standard offset femoral neck and a 36+1.5 trial hip ball.  The leg was brought over and up and with traction and internal rotation reduced  the pelvis.  We are pleased with leg length, offset, range of motion, and stability assessed both radiographically and mechanically.  We then dislocated the hip and remove the trial components.  We then placed the real Corail femoral component with standard offset size 12 and the real 36+1.5 metal hip ball and again reduce this in the acetabulum we are pleased with stability, range of motion, offset, leg length and stability assessment clinically and radiographically.  The soft tissue was then irrigated with normal saline solution.  The joint capsule was closed with interrupted #1 Ethibond suture followed by #1 Vicryl to close the tensor fascia.  0 Vicryl was used to close the deep tissue and 2-0 Vicryl was used to close the subcutaneous tissue.  The skin was closed with staples.  An Aquacel dressing was placed over the incision.  The patient was taken off the Hana table and taken recovery in stable addition with all final counts being correct and no complications noted.  Of note Rexene Edison PA-C's assistance was medically necessary throughout this case and was needed for soft tissue management and retracting, helping guide implant placement and a layered closure of the wound. Marland Kitchen

## 2021-11-15 NOTE — Anesthesia Procedure Notes (Signed)
Spinal  Patient location during procedure: OR Start time: 11/15/2021 2:18 PM End time: 11/15/2021 2:23 PM Reason for block: surgical anesthesia Staffing Performed: anesthesiologist  Anesthesiologist: Marcene Duos, MD Performed by: Marcene Duos, MD Authorized by: Marcene Duos, MD   Preanesthetic Checklist Completed: patient identified, IV checked, site marked, risks and benefits discussed, surgical consent, monitors and equipment checked, pre-op evaluation and timeout performed Spinal Block Patient position: sitting Prep: DuraPrep Patient monitoring: heart rate, cardiac monitor, continuous pulse ox and blood pressure Approach: midline Location: L4-5 Injection technique: single-shot Needle Needle type: Pencan  Needle gauge: 24 G Needle length: 9 cm Assessment Sensory level: T4 Events: CSF return

## 2021-11-15 NOTE — Transfer of Care (Signed)
Immediate Anesthesia Transfer of Care Note  Patient: Jesus Wagner  Procedure(s) Performed: RIGHT TOTAL HIP ARTHROPLASTY ANTERIOR APPROACH (Right: Hip)  Patient Location: PACU  Anesthesia Type:Spinal  Level of Consciousness: drowsy  Airway & Oxygen Therapy: Patient Spontanous Breathing and Patient connected to face mask oxygen  Post-op Assessment: Report given to RN and Post -op Vital signs reviewed and stable  Post vital signs: Reviewed and stable  Last Vitals:  Vitals Value Taken Time  BP 108/55 11/15/21 1552  Temp    Pulse 54 11/15/21 1554  Resp 10 11/15/21 1554  SpO2 100 % 11/15/21 1554  Vitals shown include unvalidated device data.  Last Pain:  Vitals:   11/15/21 1247  TempSrc: Oral      Patients Stated Pain Goal: 4 (11/15/21 1301)  Complications: No notable events documented.

## 2021-11-16 ENCOUNTER — Other Ambulatory Visit: Payer: Self-pay

## 2021-11-16 DIAGNOSIS — M1611 Unilateral primary osteoarthritis, right hip: Secondary | ICD-10-CM | POA: Diagnosis not present

## 2021-11-16 DIAGNOSIS — Z96642 Presence of left artificial hip joint: Secondary | ICD-10-CM | POA: Diagnosis not present

## 2021-11-16 DIAGNOSIS — I10 Essential (primary) hypertension: Secondary | ICD-10-CM | POA: Diagnosis not present

## 2021-11-16 DIAGNOSIS — Z79899 Other long term (current) drug therapy: Secondary | ICD-10-CM | POA: Diagnosis not present

## 2021-11-16 LAB — CBC
HCT: 36.8 % — ABNORMAL LOW (ref 39.0–52.0)
Hemoglobin: 13.2 g/dL (ref 13.0–17.0)
MCH: 32.9 pg (ref 26.0–34.0)
MCHC: 35.9 g/dL (ref 30.0–36.0)
MCV: 91.8 fL (ref 80.0–100.0)
Platelets: 162 10*3/uL (ref 150–400)
RBC: 4.01 MIL/uL — ABNORMAL LOW (ref 4.22–5.81)
RDW: 11.9 % (ref 11.5–15.5)
WBC: 8.5 10*3/uL (ref 4.0–10.5)
nRBC: 0 % (ref 0.0–0.2)

## 2021-11-16 LAB — BASIC METABOLIC PANEL
Anion gap: 4 — ABNORMAL LOW (ref 5–15)
BUN: 11 mg/dL (ref 8–23)
CO2: 27 mmol/L (ref 22–32)
Calcium: 8.6 mg/dL — ABNORMAL LOW (ref 8.9–10.3)
Chloride: 102 mmol/L (ref 98–111)
Creatinine, Ser: 0.82 mg/dL (ref 0.61–1.24)
GFR, Estimated: >60 mL/min (ref >60)
Glucose, Bld: 155 mg/dL — ABNORMAL HIGH (ref 70–99)
Potassium: 4.3 mmol/L (ref 3.5–5.1)
Sodium: 133 mmol/L — ABNORMAL LOW (ref 135–145)

## 2021-11-16 MED ORDER — GABAPENTIN 100 MG PO CAPS: 100.0000 mg | ORAL_CAPSULE | Freq: Three times a day (TID) | ORAL | 0 refills | Status: DC | PRN

## 2021-11-16 MED ORDER — OXYCODONE HCL 5 MG PO TABS: 5.0000 mg | ORAL_TABLET | ORAL | 0 refills | Status: DC | PRN

## 2021-11-16 MED ORDER — ASPIRIN 81 MG PO CHEW: 81.0000 mg | CHEWABLE_TABLET | Freq: Two times a day (BID) | ORAL | 0 refills | Status: AC

## 2021-11-16 MED ORDER — METHOCARBAMOL 500 MG PO TABS: 500.0000 mg | ORAL_TABLET | Freq: Four times a day (QID) | ORAL | 0 refills | Status: DC | PRN

## 2021-11-16 NOTE — Plan of Care (Signed)
  Problem: Education: Goal: Knowledge of the prescribed therapeutic regimen will improve Outcome: Progressing   Problem: Activity: Goal: Ability to avoid complications of mobility impairment will improve Outcome: Progressing   Problem: Pain Management: Goal: Pain level will decrease with appropriate interventions Outcome: Progressing   

## 2021-11-16 NOTE — Progress Notes (Signed)
Physical Therapy Treatment Patient Details Name: Jesus Wagner MRN: [id number] DOB: May 08, 1935 Today's Date: 11/16/2021   History of Present Illness 86 yo male s/p R THA-DA 11/15/21. Hx of L THA 2015    PT Comments    Pt reported feeling not quite like himself after getting pain meds after morning session. He felt less unsteady. Took a brief walk around the room before assisting him back to bed to rest. Will continue with PT on tomorrow.     Recommendations for follow up therapy are one component of a multi-disciplinary discharge planning process, led by the attending physician.  Recommendations may be updated based on patient status, additional functional criteria and insurance authorization.  Follow Up Recommendations  Follow physician's recommendations for discharge plan and follow up therapies     Assistance Recommended at Discharge Frequent or constant Supervision/Assistance  Patient can return home with the following A little help with walking and/or transfers;A little help with bathing/dressing/bathroom;Help with stairs or ramp for entrance;Assistance with cooking/housework;Assist for transportation   Equipment Recommendations  None recommended by PT    Recommendations for Other Services       Precautions / Restrictions Precautions Precautions: Fall Restrictions Weight Bearing Restrictions: No Other Position/Activity Restrictions: WBAT     Mobility  Bed Mobility Overal bed mobility: Needs Assistance Bed Mobility: Sit to Supine     Supine to sit: Min assist, HOB elevated Sit to supine: Mod assist   General bed mobility comments: Assist for LEs onto bed. Increased time. Cues provided.    Transfers Overall transfer level: Needs assistance Equipment used: Rolling walker (2 wheels) Transfers: Sit to/from Stand Sit to Stand: Min assist           General transfer comment: Cues for safety, technique, hand/LE placement. Assist to rise, steady, control descent.  Increased time.    Ambulation/Gait Ambulation/Gait assistance: Min assist Gait Distance (Feet): 15 Feet Assistive device: Rolling walker (2 wheels) Gait Pattern/deviations: Step-to pattern       General Gait Details: Intermittent assist to steady. Slow gait speed. No LOB with RW use but unsteady this afternoon. Walked around the room before assisting pt back to bed.   Stairs             Wheelchair Mobility    Modified Rankin (Stroke Patients Only)       Balance Overall balance assessment: Needs assistance         Standing balance support: Bilateral upper extremity supported, Reliant on assistive device for balance, During functional activity Standing balance-Leahy Scale: Poor                              Cognition Arousal/Alertness: Awake/alert Behavior During Therapy: WFL for tasks assessed/performed Overall Cognitive Status: Within Functional Limits for tasks assessed                                          Exercises Total Joint Exercises Ankle Circles/Pumps: AROM, Both, 10 reps Quad Sets: AROM, Both, 10 reps Heel Slides: AAROM, Right, 10 reps Hip ABduction/ADduction: AAROM, Right, 10 reps    General Comments        Pertinent Vitals/Pain Pain Assessment Pain Assessment: 0-10 Pain Score: 7  Pain Location: R hip/thigh Pain Descriptors / Indicators: Discomfort, Sore Pain Intervention(s): Limited activity within patient's tolerance, Monitored during session, Repositioned    Home  Living                          Prior Function            PT Goals (current goals can now be found in the care plan section) Progress towards PT goals: Progressing toward goals    Frequency    7X/week      PT Plan Current plan remains appropriate    Co-evaluation              AM-PAC PT "6 Clicks" Mobility   Outcome Measure  Help needed turning from your back to your side while in a flat bed without using  bedrails?: A Little Help needed moving from lying on your back to sitting on the side of a flat bed without using bedrails?: A Little Help needed moving to and from a bed to a chair (including a wheelchair)?: A Little Help needed standing up from a chair using your arms (e.g., wheelchair or bedside chair)?: A Little Help needed to walk in hospital room?: A Little Help needed climbing 3-5 steps with a railing? : A Little 6 Click Score: 18    End of Session Equipment Utilized During Treatment: Gait belt Activity Tolerance: Patient tolerated treatment well Patient left: in bed;with call bell/phone within reach;with bed alarm set   PT Visit Diagnosis: Other abnormalities of gait and mobility (R26.89);Pain Pain - Right/Left: Right Pain - part of body: Hip     Time: 5956-3875 PT Time Calculation (min) (ACUTE ONLY): 9 min  Charges:  $Gait Training: 8-22 mins                        Faye Ramsay, PT Acute Rehabilitation  Office: [phone] Pager: [phone]

## 2021-11-16 NOTE — Discharge Instructions (Signed)

## 2021-11-16 NOTE — Evaluation (Signed)
Physical Therapy Evaluation Patient Details Name: Jesus Wagner MRN: [id number] DOB: 1936/02/25 Today's Date: 11/16/2021  History of Present Illness  86 yo male s/p R THA-DA 11/15/21. Hx of L THA 2015  Clinical Impression  On eval, pt was Min A for mobility. He walked ~85 feet with a RW. Moderate pain with activity. Will continue to follow and progress activity as tolerated. Pt is requesting to remain overnight to continue to work with therapy-MD aware.        Recommendations for follow up therapy are one component of a multi-disciplinary discharge planning process, led by the attending physician.  Recommendations may be updated based on patient status, additional functional criteria and insurance authorization.  Follow Up Recommendations Follow physician's recommendations for discharge plan and follow up therapies      Assistance Recommended at Discharge Intermittent Supervision/Assistance  Patient can return home with the following  A little help with walking and/or transfers;A little help with bathing/dressing/bathroom;Help with stairs or ramp for entrance;Assistance with cooking/housework;Assist for transportation    Equipment Recommendations None recommended by PT  Recommendations for Other Services       Functional Status Assessment Patient has had a recent decline in their functional status and demonstrates the ability to make significant improvements in function in a reasonable and predictable amount of time.     Precautions / Restrictions Precautions Precautions: Fall Restrictions Weight Bearing Restrictions: No Other Position/Activity Restrictions: WBAT      Mobility  Bed Mobility Overal bed mobility: Needs Assistance Bed Mobility: Supine to Sit     Supine to sit: Min assist, HOB elevated     General bed mobility comments: Assist for R LE. Increased time. Cues provided.    Transfers Overall transfer level: Needs assistance Equipment used: Rolling walker (2  wheels) Transfers: Sit to/from Stand Sit to Stand: Min assist, From elevated surface           General transfer comment: Cues for safety, technique, hand/LE placement. Assist to rise, steady, control descent. Increased time.    Ambulation/Gait Ambulation/Gait assistance: Min assist Gait Distance (Feet): 85 Feet Assistive device: Rolling walker (2 wheels) Gait Pattern/deviations: Step-to pattern       General Gait Details: Intermittent assist to steady. Slow gait speed. No LOB with RW use. Followed with recliner but did not need to use it.  Stairs            Wheelchair Mobility    Modified Rankin (Stroke Patients Only)       Balance Overall balance assessment: Needs assistance         Standing balance support: Bilateral upper extremity supported, Reliant on assistive device for balance, During functional activity Standing balance-Leahy Scale: Fair                               Pertinent Vitals/Pain Pain Assessment Pain Assessment: 0-10 Pain Score: 7  Pain Location: R hip Pain Descriptors / Indicators: Discomfort, Sore Pain Intervention(s): Monitored during session, Limited activity within patient's tolerance, Ice applied    Home Living Family/patient expects to be discharged to:: Private residence Living Arrangements: Spouse/significant other Available Help at Discharge: Family Type of Home: House Home Access: Stairs to enter Entrance Stairs-Rails: Right;Left;Can reach both Entrance Stairs-Number of Steps: 6 Alternate Level Stairs-Number of Steps: 1 flight Home Layout: Able to live on main level with bedroom/bathroom;Two level Home Equipment: Agricultural consultant (2 wheels);Cane - quad;Cane - single point;BSC/3in1  Prior Function Prior Level of Function : Independent/Modified Independent                     Hand Dominance        Extremity/Trunk Assessment   Upper Extremity Assessment Upper Extremity Assessment: Overall WFL  for tasks assessed    Lower Extremity Assessment Lower Extremity Assessment: Generalized weakness    Cervical / Trunk Assessment Cervical / Trunk Assessment: Normal  Communication   Communication: No difficulties  Cognition Arousal/Alertness: Awake/alert Behavior During Therapy: WFL for tasks assessed/performed Overall Cognitive Status: Within Functional Limits for tasks assessed                                          General Comments      Exercises Total Joint Exercises Ankle Circles/Pumps: AROM, Both, 10 reps Quad Sets: AROM, Both, 10 reps Heel Slides: AAROM, Right, 10 reps Hip ABduction/ADduction: AAROM, Right, 10 reps   Assessment/Plan    PT Assessment Patient needs continued PT services  PT Problem List Decreased strength;Decreased range of motion;Decreased mobility;Decreased activity tolerance;Decreased balance;Decreased knowledge of use of DME;Pain       PT Treatment Interventions DME instruction;Gait training;Therapeutic activities;Therapeutic exercise;Patient/family education;Balance training;Stair training;Functional mobility training    PT Goals (Current goals can be found in the Care Plan section)  Acute Rehab PT Goals Patient Stated Goal: less pain PT Goal Formulation: With patient Time For Goal Achievement: 11/30/21 Potential to Achieve Goals: Good    Frequency 7X/week     Co-evaluation               AM-PAC PT "6 Clicks" Mobility  Outcome Measure Help needed turning from your back to your side while in a flat bed without using bedrails?: A Little Help needed moving from lying on your back to sitting on the side of a flat bed without using bedrails?: A Little Help needed moving to and from a bed to a chair (including a wheelchair)?: A Little Help needed standing up from a chair using your arms (e.g., wheelchair or bedside chair)?: A Little Help needed to walk in hospital room?: A Little Help needed climbing 3-5 steps with a  railing? : A Little 6 Click Score: 18    End of Session Equipment Utilized During Treatment: Gait belt Activity Tolerance: Patient tolerated treatment well Patient left: in chair;with call bell/phone within reach;with family/visitor present   PT Visit Diagnosis: Other abnormalities of gait and mobility (R26.89);Pain Pain - Right/Left: Right Pain - part of body: Hip    Time: 6283-1517 PT Time Calculation (min) (ACUTE ONLY): 31 min   Charges:   PT Evaluation $PT Eval Low Complexity: 1 Low PT Treatments $Gait Training: 8-22 mins           Faye Ramsay, PT Acute Rehabilitation  Office: [phone] Pager: [phone]

## 2021-11-16 NOTE — Progress Notes (Signed)
Subjective: 1 Day Post-Op Procedure(s) (LRB): RIGHT TOTAL HIP ARTHROPLASTY ANTERIOR APPROACH (Right) Patient reports pain as moderate.    Objective: Vital signs in last 24 hours: Temp:  [97.7 F (36.5 C)-99 F (37.2 C)] 98.1 F (36.7 C) (08/26 1021) Pulse Rate:  [51-86] 74 (08/26 1021) Resp:  [12-18] 18 (08/26 1021) BP: (106-167)/(55-91) 142/59 (08/26 1021) SpO2:  [95 %-100 %] 98 % (08/26 1021) Weight:  [87.1 kg] 87.1 kg (08/25 1301)  Intake/Output from previous day: 08/25 0701 - 08/26 0700 In: 1981.7 [I.V.:1626.7; IV Piggyback:355] Out: 2875 [Urine:2725; Blood:150] Intake/Output this shift: Total I/O In: 429.8 [P.O.:240; I.V.:189.8] Out: 250 [Urine:250]  Recent Labs    11/16/21 0416  HGB 13.2   Recent Labs    11/16/21 0416  WBC 8.5  RBC 4.01*  HCT 36.8*  PLT 162   Recent Labs    11/16/21 0416  NA 133*  K 4.3  CL 102  CO2 27  BUN 11  CREATININE 0.82  GLUCOSE 155*  CALCIUM 8.6*   No results for input(s): "LABPT", "INR" in the last 72 hours.  Sensation intact distally Intact pulses distally Dorsiflexion/Plantar flexion intact Incision: scant drainage   Assessment/Plan: 1 Day Post-Op Procedure(s) (LRB): RIGHT TOTAL HIP ARTHROPLASTY ANTERIOR APPROACH (Right) Up with therapy Plan for discharge tomorrow Discharge home with home health      Kathryne Hitch 11/16/2021, 11:53 AM

## 2021-11-17 DIAGNOSIS — M1611 Unilateral primary osteoarthritis, right hip: Secondary | ICD-10-CM | POA: Diagnosis not present

## 2021-11-17 NOTE — Progress Notes (Signed)
Physical Therapy Treatment Patient Details Name: Jesus Wagner MRN: [id number] DOB: [birth year] Today's Date: 11/17/2021   History of Present Illness 86 yo male s/p R THA-DA 11/15/21. Hx of L THA 2015    PT Comments    Progressing with mobility. Pt was able to walk ~175 feet and ascend/descend 2 stairs. Pain rated 7/10 during session-RN giving meds. Pt stated he does not wish to d/c home on today-RN aware. Will continue to progress activity. Encouraged pt to continue working towards getting home and explained that his wife will still likely have to assist him some physically when he gets home.     Recommendations for follow up therapy are one component of a multi-disciplinary discharge planning process, led by the attending physician.  Recommendations may be updated based on patient status, additional functional criteria and insurance authorization.  Follow Up Recommendations  Follow physician's recommendations for discharge plan and follow up therapies     Assistance Recommended at Discharge Frequent or constant Supervision/Assistance  Patient can return home with the following A little help with walking and/or transfers;A little help with bathing/dressing/bathroom;Help with stairs or ramp for entrance;Assistance with cooking/housework;Assist for transportation   Equipment Recommendations  None recommended by PT    Recommendations for Other Services       Precautions / Restrictions Precautions Precautions: Fall Restrictions Weight Bearing Restrictions: No Other Position/Activity Restrictions: WBAT     Mobility  Bed Mobility Overal bed mobility: Needs Assistance Bed Mobility: Supine to Sit     Supine to sit: Min assist, HOB elevated     General bed mobility comments: oob in recliner    Transfers Overall transfer level: Needs assistance Equipment used: Rolling walker (2 wheels) Transfers: Sit to/from Stand Sit to Stand: Min guard           General transfer  comment: Cues for safety, technique, hand/LE placement. Increased time.    Ambulation/Gait Ambulation/Gait assistance: Min guard Gait Distance (Feet): 175 Feet Assistive device: Rolling walker (2 wheels) Gait Pattern/deviations: Step-through pattern, Decreased step length - right, Decreased stride length       General Gait Details: Slow gait speed. No LOB with RW. Pt tolerated distance well.   Stairs Stairs: Yes Stairs assistance: Min guard Stair Management: Step to pattern, Forwards, Two rails Number of Stairs: 2 General stair comments: up and over portable stairs x 1. cues for safety, technique, sequence   Wheelchair Mobility    Modified Rankin (Stroke Patients Only)       Balance Overall balance assessment: Needs assistance         Standing balance support: Bilateral upper extremity supported, Reliant on assistive device for balance, During functional activity Standing balance-Leahy Scale: Poor                              Cognition Arousal/Alertness: Awake/alert Behavior During Therapy: WFL for tasks assessed/performed Overall Cognitive Status: Within Functional Limits for tasks assessed                                          Exercises Total Joint Exercises Ankle Circles/Pumps: AROM, Both, 10 reps Quad Sets: AROM, Both, 10 reps Heel Slides: AAROM, Right, 10 reps Hip ABduction/ADduction: AAROM, Right, 10 reps    General Comments        Pertinent Vitals/Pain Pain Assessment Pain Assessment: 0-10 Pain Score: 7  Pain Location: R hip/thigh Pain Descriptors / Indicators: Discomfort, Sore Pain Intervention(s): Limited activity within patient's tolerance, Monitored during session, Repositioned    Home Living                          Prior Function            PT Goals (current goals can now be found in the care plan section) Progress towards PT goals: Progressing toward goals    Frequency     7X/week      PT Plan Current plan remains appropriate    Co-evaluation              AM-PAC PT "6 Clicks" Mobility   Outcome Measure  Help needed turning from your back to your side while in a flat bed without using bedrails?: A Little Help needed moving from lying on your back to sitting on the side of a flat bed without using bedrails?: A Little Help needed moving to and from a bed to a chair (including a wheelchair)?: A Little Help needed standing up from a chair using your arms (e.g., wheelchair or bedside chair)?: A Little Help needed to walk in hospital room?: A Little Help needed climbing 3-5 steps with a railing? : A Little 6 Click Score: 18    End of Session Equipment Utilized During Treatment: Gait belt Activity Tolerance: Patient tolerated treatment well Patient left:  (in bathroom with instructions to pull call light cord when finished)   PT Visit Diagnosis: Other abnormalities of gait and mobility (R26.89);Pain Pain - Right/Left: Right Pain - part of body: Hip     Time: 4098-1191 PT Time Calculation (min) (ACUTE ONLY): 23 min  Charges:  $Gait Training: 23-37 mins $Therapeutic Exercise: 8-22 mins                        Faye Ramsay, PT Acute Rehabilitation  Office: [phone] Pager: [phone]

## 2021-11-17 NOTE — Progress Notes (Signed)
  Subjective: Jesus Wagner is a 86 y.o. male s/p right THA.  They are POD 2.  Pt's pain is controlled and improved compared with yesterday.  He was able to walk 85 feet and then 15 feet with PT yesterday.  Did not really feel like himself last night but feels much better today.  No chest pain, shortness of breath, abdominal pain, headache, calf pain.  He is eating and his appetite is coming back.  Passing gas well.  Objective: Vital signs in last 24 hours: Temp:  [98.2 F (36.8 C)-100 F (37.8 C)] 98.2 F (36.8 C) (08/27 0508) Pulse Rate:  [70-83] 83 (08/27 0508) Resp:  [16-18] 16 (08/27 0508) BP: (136-166)/(62-72) 149/63 (08/27 0834) SpO2:  [95 %-99 %] 96 % (08/27 0508)  Intake/Output from previous day: 08/26 0701 - 08/27 0700 In: 1269.8 [P.O.:1080; I.V.:189.8] Out: 1200 [Urine:1200] Intake/Output this shift: Total I/O In: 240 [P.O.:240] Out: 450 [Urine:450]  Exam:  No gross blood or drainage overlying the dressing 2+ DP pulse Sensation intact distally in the right foot Able to dorsiflex and plantarflex the right foot No calf tenderness.  Negative Homans' sign.   Labs: Recent Labs    11/16/21 0416  HGB 13.2   Recent Labs    11/16/21 0416  WBC 8.5  RBC 4.01*  HCT 36.8*  PLT 162   Recent Labs    11/16/21 0416  NA 133*  K 4.3  CL 102  CO2 27  BUN 11  CREATININE 0.82  GLUCOSE 155*  CALCIUM 8.6*   No results for input(s): "LABPT", "INR" in the last 72 hours.  Assessment/Plan: Pt is POD 2 s/p right THA.    -Plan to discharge to home today or tomorrow pending patient's pain and PT eval  -WBAT with a walker  -Follow-up with Dr. Magnus Ivan in 2 weeks postoperatively     Julieanne Cotton 11/17/2021, 10:42 AM

## 2021-11-17 NOTE — Progress Notes (Signed)
Physical Therapy Treatment Patient Details Name: Jesus Wagner MRN: [id number] DOB: Oct 28, 1935 Today's Date: 11/17/2021   History of Present Illness 86 yo male s/p R THA-DA 11/15/21. Hx of L THA 2015    PT Comments    Progressing with mobility. Will plan to practice stair negotiation this afternoon.    Recommendations for follow up therapy are one component of a multi-disciplinary discharge planning process, led by the attending physician.  Recommendations may be updated based on patient status, additional functional criteria and insurance authorization.  Follow Up Recommendations  Follow physician's recommendations for discharge plan and follow up therapies     Assistance Recommended at Discharge Frequent or constant Supervision/Assistance  Patient can return home with the following A little help with walking and/or transfers;A little help with bathing/dressing/bathroom;Help with stairs or ramp for entrance;Assistance with cooking/housework;Assist for transportation   Equipment Recommendations  None recommended by PT    Recommendations for Other Services       Precautions / Restrictions Precautions Precautions: Fall Restrictions Weight Bearing Restrictions: No Other Position/Activity Restrictions: WBAT     Mobility  Bed Mobility Overal bed mobility: Needs Assistance Bed Mobility: Supine to Sit     Supine to sit: Min assist, HOB elevated     General bed mobility comments: Assist for LE. Increased time. Cues provided.    Transfers Overall transfer level: Needs assistance Equipment used: Rolling walker (2 wheels) Transfers: Sit to/from Stand Sit to Stand: Min guard, From elevated surface           General transfer comment: Cues for safety, technique, hand/LE placement. Increased time.    Ambulation/Gait Ambulation/Gait assistance: Min guard Gait Distance (Feet): 165 Feet Assistive device: Rolling walker (2 wheels) Gait Pattern/deviations: Step-to pattern        General Gait Details: Slow gait speed. No LOB with RW. Pt tolerated distance well.   Stairs             Wheelchair Mobility    Modified Rankin (Stroke Patients Only)       Balance Overall balance assessment: Needs assistance         Standing balance support: Bilateral upper extremity supported, Reliant on assistive device for balance, During functional activity Standing balance-Leahy Scale: Poor                              Cognition Arousal/Alertness: Awake/alert Behavior During Therapy: WFL for tasks assessed/performed Overall Cognitive Status: Within Functional Limits for tasks assessed                                          Exercises Total Joint Exercises Ankle Circles/Pumps: AROM, Both, 10 reps Quad Sets: AROM, Both, 10 reps Heel Slides: AAROM, Right, 10 reps Hip ABduction/ADduction: AAROM, Right, 10 reps    General Comments        Pertinent Vitals/Pain Pain Assessment Pain Assessment: 0-10 Pain Score: 7  Pain Location: R hip/thigh Pain Descriptors / Indicators: Discomfort, Sore Pain Intervention(s): Limited activity within patient's tolerance, Monitored during session, Repositioned    Home Living                          Prior Function            PT Goals (current goals can now be found in the care plan section)  Progress towards PT goals: Progressing toward goals    Frequency    7X/week      PT Plan Current plan remains appropriate    Co-evaluation              AM-PAC PT "6 Clicks" Mobility   Outcome Measure  Help needed turning from your back to your side while in a flat bed without using bedrails?: A Little Help needed moving from lying on your back to sitting on the side of a flat bed without using bedrails?: A Little Help needed moving to and from a bed to a chair (including a wheelchair)?: A Little Help needed standing up from a chair using your arms (e.g., wheelchair or  bedside chair)?: A Little Help needed to walk in hospital room?: A Little Help needed climbing 3-5 steps with a railing? : A Little 6 Click Score: 18    End of Session Equipment Utilized During Treatment: Gait belt Activity Tolerance: Patient tolerated treatment well Patient left: in chair;with call bell/phone within reach;with family/visitor present   PT Visit Diagnosis: Other abnormalities of gait and mobility (R26.89);Pain Pain - Right/Left: Right Pain - part of body: Hip     Time: 8099-8338 PT Time Calculation (min) (ACUTE ONLY): 34 min  Charges:  $Gait Training: 8-22 mins $Therapeutic Exercise: 8-22 mins                        Faye Ramsay, PT Acute Rehabilitation  Office: [phone] Pager: [phone]

## 2021-11-17 NOTE — Plan of Care (Signed)
  Problem: Education: Goal: Knowledge of the prescribed therapeutic regimen will improve Outcome: Progressing   Problem: Activity: Goal: Ability to tolerate increased activity will improve Outcome: Progressing   Problem: Pain Management: Goal: Pain level will decrease with appropriate interventions Outcome: Progressing   

## 2021-11-18 ENCOUNTER — Encounter (HOSPITAL_COMMUNITY): Payer: Self-pay | Admitting: Orthopaedic Surgery

## 2021-11-18 DIAGNOSIS — M1611 Unilateral primary osteoarthritis, right hip: Secondary | ICD-10-CM | POA: Diagnosis not present

## 2021-11-18 NOTE — TOC Transition Note (Signed)
Transition of Care [facility]) - CM/SW Discharge Note  Patient Details  Name: Jesus Wagner MRN: [id number] Date of Birth: [birth year]  [facility] CM/SW Contact:  Sherie Don, LCSW Phone Number: 11/18/2021, 10:02 AM  Clinical Narrative: Patient is expected to discharge home after working with PT. CSW met with patient and spouse to review discharge plan. Patient will go home with HHPT, which was prearranged through [place]. Patient has a rolling walker, shower chair, and raised toilet seat at home so there are no DME needs at this time. TOC signing off.    Final next level of care: [place] Barriers to Discharge: No Barriers Identified  Patient Goals and CMS Choice Patient states their goals for this hospitalization and ongoing recovery are:: Discharge home with [place] CMS Medicare.gov Compare Post Acute Care list provided to:: Patient Choice offered to / list presented to : Patient  Discharge Plan and Services         DME Arranged: N/A DME Agency: NA HH Arranged: PT HH Agency: [place] Representative spoke with at [place]: Prearranged in orthopedist's office  Readmission Risk Interventions     No data to display

## 2021-11-18 NOTE — Plan of Care (Signed)
Problem: Education: Goal: Knowledge of the prescribed therapeutic regimen will improve Outcome: Progressing   Problem: Clinical Measurements: Goal: Postoperative complications will be avoided or minimized Outcome: Progressing   Problem: Health Behavior/Discharge Planning: Goal: Ability to manage health-related needs will improve Outcome: Progressing  Haydee Salter, RN. 11/18/21 10:53 AM

## 2021-11-18 NOTE — Plan of Care (Signed)
Patient discharging home with wife via private vehicle. Haydee Salter, RN. 11/18/21 12:06 PM

## 2021-11-18 NOTE — Progress Notes (Signed)
Physical Therapy Treatment Patient Details Name: Jesus Wagner MRN: [id number] DOB: 1935-11-10 Today's Date: 11/18/2021   History of Present Illness 86 yo male s/p R THA-DA 11/15/21. Hx of L THA 2015    PT Comments    Reviewed/practiced exercises, gait training, and stair training. Encouraged pt to mobilize often at home. All education completed.    Recommendations for follow up therapy are one component of a multi-disciplinary discharge planning process, led by the attending physician.  Recommendations may be updated based on patient status, additional functional criteria and insurance authorization.  Follow Up Recommendations  Follow physician's recommendations for discharge plan and follow up therapies     Assistance Recommended at Discharge Frequent or constant Supervision/Assistance  Patient can return home with the following A little help with walking and/or transfers;A little help with bathing/dressing/bathroom;Help with stairs or ramp for entrance;Assistance with cooking/housework;Assist for transportation   Equipment Recommendations  None recommended by PT    Recommendations for Other Services       Precautions / Restrictions Precautions Precautions: Fall Restrictions Weight Bearing Restrictions: No RLE Weight Bearing: Weight bearing as tolerated     Mobility  Bed Mobility Overal bed mobility: Needs Assistance Bed Mobility: Supine to Sit     Supine to sit: Min guard, HOB elevated     General bed mobility comments: Pt used gait belt as leg lifter to assist R LE on/off bed. Increased time. Cues provided.    Transfers Overall transfer level: Needs assistance Equipment used: Rolling walker (2 wheels) Transfers: Sit to/from Stand Sit to Stand: Min guard, From elevated surface           General transfer comment: Cues for safety, technique, hand/LE placement. Increased time.    Ambulation/Gait Ambulation/Gait assistance: Min guard Gait Distance (Feet):  175 Feet Assistive device: Rolling walker (2 wheels) Gait Pattern/deviations: Step-through pattern, Decreased step length - right, Decreased stride length       General Gait Details: Slow gait speed. No LOB with RW. Pt tolerated distance well.   Stairs Stairs: Yes Stairs assistance: Min guard Stair Management: Step to pattern, Forwards, Two rails Number of Stairs: 2 General stair comments: up and over portable stairs x 1. cues for safety, technique, sequence   Wheelchair Mobility    Modified Rankin (Stroke Patients Only)       Balance Overall balance assessment: Needs assistance         Standing balance support: Bilateral upper extremity supported, Reliant on assistive device for balance, During functional activity Standing balance-Leahy Scale: Fair                              Cognition Arousal/Alertness: Awake/alert Behavior During Therapy: WFL for tasks assessed/performed Overall Cognitive Status: Within Functional Limits for tasks assessed                                          Exercises Total Joint Exercises Ankle Circles/Pumps: AROM, Both, 10 reps Quad Sets: AROM, Both, 10 reps Heel Slides: AAROM, Right, 10 reps Hip ABduction/ADduction: AAROM, Right, 10 reps    General Comments        Pertinent Vitals/Pain Pain Assessment Pain Assessment: 0-10 Pain Score: 7  Pain Location: R hip/thigh Pain Descriptors / Indicators: Discomfort, Sore Pain Intervention(s): Monitored during session, Repositioned, Ice applied    Home Living  Prior Function            PT Goals (current goals can now be found in the care plan section) Progress towards PT goals: Progressing toward goals    Frequency    7X/week      PT Plan Current plan remains appropriate    Co-evaluation              AM-PAC PT "6 Clicks" Mobility   Outcome Measure  Help needed turning from your back to your side  while in a flat bed without using bedrails?: A Little Help needed moving from lying on your back to sitting on the side of a flat bed without using bedrails?: A Little Help needed moving to and from a bed to a chair (including a wheelchair)?: A Little Help needed standing up from a chair using your arms (e.g., wheelchair or bedside chair)?: A Little Help needed to walk in hospital room?: A Little Help needed climbing 3-5 steps with a railing? : A Little 6 Click Score: 18    End of Session Equipment Utilized During Treatment: Gait belt Activity Tolerance: Patient tolerated treatment well Patient left: in chair;with call bell/phone within reach;with family/visitor present   PT Visit Diagnosis: Other abnormalities of gait and mobility (R26.89);Pain Pain - Right/Left: Right Pain - part of body: Hip     Time: 0927-0949 PT Time Calculation (min) (ACUTE ONLY): 22 min  Charges:  $Gait Training: 8-22 mins                         Faye Ramsay, PT Acute Rehabilitation  Office: [phone] Pager: [phone]

## 2021-11-18 NOTE — Progress Notes (Signed)
Patient ID: Jesus Wagner, male   DOB: [birth year], 86 y.o.   MRN: [id number] Doing better overall.  Vitals stable and right operative hip stable.  Dressing clean and dry.  Can be discharge to home after therapy today.

## 2021-11-18 NOTE — Discharge Summary (Signed)
Patient ID: Jesus Wagner MRN: [id number] DOB/AGE: [birth year] 86 y.o.  Admit date: 11/15/2021 Discharge date: 11/18/2021  Admission Diagnoses:  Principal Problem:   Arthritis of right hip Active Problems:   Status post total replacement of right hip   Discharge Diagnoses:  Same  Past Medical History:  Diagnosis Date   Arthritis    Hypertension    Prostate enlargement     Surgeries: Procedure(s): RIGHT TOTAL HIP ARTHROPLASTY ANTERIOR APPROACH on 11/15/2021   Consultants:   Discharged Condition: Improved  Hospital Course: Irvin Bastin is an 86 y.o. male who was admitted 11/15/2021 for operative treatment ofArthritis of right hip. Patient has severe unremitting pain that affects sleep, daily activities, and work/hobbies. After pre-op clearance the patient was taken to the operating room on 11/15/2021 and underwent  Procedure(s): RIGHT TOTAL HIP ARTHROPLASTY ANTERIOR APPROACH.    Patient was given perioperative antibiotics:  Anti-infectives (From admission, onward)    Start     Dose/Rate Route Frequency Ordered Stop   11/16/21 0600  ceFAZolin (ANCEF) IVPB 2g/100 mL premix        2 g 200 mL/hr over 30 Minutes Intravenous On call to O.R. 11/15/21 1227 11/15/21 1454   11/15/21 2030  ceFAZolin (ANCEF) IVPB 1 g/50 mL premix        1 g 100 mL/hr over 30 Minutes Intravenous Every 6 hours 11/15/21 1936 11/16/21 0318        Patient was given sequential compression devices, early ambulation, and chemoprophylaxis to prevent DVT.  Patient benefited maximally from hospital stay and there were no complications.    Recent vital signs: Patient Vitals for the past 24 hrs:  BP Temp Temp src Pulse Resp SpO2  11/18/21 0519 (!) 140/72 99.3 F (37.4 C) Oral 87 18 94 %  11/17/21 2210 (!) 145/66 98.4 F (36.9 C) Oral 84 18 100 %  11/17/21 1340 (!) 128/53 98.4 F (36.9 C) -- 92 18 97 %  11/17/21 0834 (!) 149/63 -- -- -- -- --     Recent laboratory studies:  Recent Labs     11/16/21 0416  WBC 8.5  HGB 13.2  HCT 36.8*  PLT 162  NA 133*  K 4.3  CL 102  CO2 27  BUN 11  CREATININE 0.82  GLUCOSE 155*  CALCIUM 8.6*     Discharge Medications:   Allergies as of 11/18/2021       Reactions   Alendronate    hurting   Aspirin    Stomach upset    Codeine Nausea Only        Medication List     STOP taking these medications    HYDROcodone-acetaminophen 5-325 MG tablet Commonly known as: Norco       TAKE these medications    acetaminophen 500 MG tablet Commonly known as: TYLENOL Take 2 tablets (1,000 mg total) by mouth every 6 (six) hours. What changed:  how much to take when to take this reasons to take this   alum & mag hydroxide-simeth 200-200-20 MG/5ML suspension Commonly known as: MAALOX/MYLANTA Take 15 mLs by mouth every 6 (six) hours as needed for indigestion or heartburn.   amLODipine 2.5 MG tablet Commonly known as: NORVASC Take 2.5 mg by mouth daily.   aspirin 81 MG chewable tablet Chew 1 tablet (81 mg total) by mouth 2 (two) times daily.   cholecalciferol 1000 units tablet Commonly known as: VITAMIN D Take 2,000 Units by mouth daily.   diclofenac Sodium 1 % Gel Commonly known as: VOLTAREN  Apply 1 Application topically 4 (four) times daily as needed (pain).   docusate sodium 100 MG capsule Commonly known as: COLACE Take 100-200 mg by mouth See admin instructions. Take 200 mg in the morning and 100 mg midday   gabapentin 100 MG capsule Commonly known as: NEURONTIN Take 1 capsule (100 mg total) by mouth 3 (three) times daily as needed.   GAS RELIEF PO Take 2 tablets by mouth daily as needed (gas).   Linzess 145 MCG Caps capsule Generic drug: linaclotide Take 145 mcg by mouth daily as needed (constipation).   losartan 100 MG tablet Commonly known as: COZAAR Take 100 mg by mouth daily.   methocarbamol 500 MG tablet Commonly known as: ROBAXIN Take 1 tablet (500 mg total) by mouth every 6 (six) hours as  needed for muscle spasms.   oxyCODONE 5 MG immediate release tablet Commonly known as: Oxy IR/ROXICODONE Take 1-2 tablets (5-10 mg total) by mouth every 4 (four) hours as needed for moderate pain (pain score 4-6).   rosuvastatin 10 MG tablet Commonly known as: CRESTOR Take 10 mg by mouth daily.               Durable Medical Equipment  (From admission, onward)           Start     Ordered   11/15/21 1936  DME 3 n 1  Once        11/15/21 1935   11/15/21 1936  DME Walker rolling  Once       Question Answer Comment  Walker: With 5 Inch Wheels   Patient needs a walker to treat with the following condition Status post total replacement of right hip      11/15/21 1935            Diagnostic Studies: DG Pelvis Portable  Result Date: 11/15/2021 CLINICAL DATA:  Status post hip surgery. EXAM: PORTABLE PELVIS 1-2 VIEWS COMPARISON:  October 07, 2021 FINDINGS: There is a total right hip replacement without evidence of surrounding lucency to suggest the presence of hardware loosening or infection. This represents a new finding when compared to the prior study. An intact left hip replacement is again seen. There is no evidence of an acute pelvic fracture or diastasis. No pelvic bone lesions are seen. Radiopaque skin staples are seen along the lateral aspect of the right hip. IMPRESSION: Intact bilateral total hip replacements. Electronically Signed   By: Aram Candela M.D.   On: 11/15/2021 16:42   DG HIP UNILAT WITH PELVIS 2-3 VIEWS RIGHT  Result Date: 11/15/2021 CLINICAL DATA:  Right hip replacement. EXAM: DG HIP (WITH OR WITHOUT PELVIS) 2-3V RIGHT COMPARISON:  07/11/2021.  Right hip MR dated 10/30/2021. FINDINGS: Two frontal C-arm images demonstrate an interval right hip bipolar prosthesis in satisfactory position and alignment. No fracture or dislocation seen. Stable left hip prosthesis. IMPRESSION: Satisfactory appearance of a right hip prosthesis. Electronically Signed   By: Beckie Salts M.D.   On: 11/15/2021 16:13   DG C-Arm 1-60 Min-No Report  Result Date: 11/15/2021 Fluoroscopy was utilized by the requesting physician.  No radiographic interpretation.   DG C-Arm 1-60 Min-No Report  Result Date: 11/15/2021 Fluoroscopy was utilized by the requesting physician.  No radiographic interpretation.   MR Hip Right w/o contrast  Result Date: 10/30/2021 CLINICAL DATA:  Right hip pain for 3 months radiates to the right leg. EXAM: MR OF THE RIGHT HIP WITHOUT CONTRAST TECHNIQUE: Multiplanar, multisequence MR imaging was performed. No intravenous contrast  was administered. COMPARISON:  None Available. FINDINGS: Patient motion degrades image quality limiting evaluation. Bones: Left hip arthroplasty with susceptibility artifact partially obscuring the adjacent soft tissue and osseous structures. No periarticular fluid collection or osteolysis. Subchondral linear low signal in the superior right femoral head with severe surrounding bone marrow edema consistent with a nondisplaced, nondepressed subchondral insufficiency fracture. No other fracture, dislocation or avascular necrosis. No periosteal reaction or bone destruction. No aggressive osseous lesion. Normal sacrum and sacroiliac joints. No SI joint widening or erosive changes. Degenerative disease with disc height loss at L5-S1. Articular cartilage and labrum Articular cartilage: High-grade partial-thickness cartilage loss with areas of full-thickness cartilage loss of the right femoral head and acetabulum with subchondral reactive marrow edema. Labrum:  Right anterior labral tear. Joint or bursal effusion Joint effusion: Large right hip joint effusion. No SI joint effusion. Bursae:  No bursa formation. Muscles and tendons Flexors: Normal Extensors: Normal. Abductors: Normal. Adductors: Normal. Gluteals: Normal. Hamstrings: Normal. Other findings No pelvic free fluid. No fluid collection or hematoma. No inguinal lymphadenopathy. No inguinal  hernia. IMPRESSION: 1. Nondisplaced, nondepressed subchondral insufficiency fracture of the right femoral head with severe surrounding bone marrow edema. 2. Moderate-severe osteoarthritis of the right hip. 3. Large right hip joint effusion. Electronically Signed   By: Kathreen Devoid M.D.   On: 10/30/2021 09:24    Disposition: Discharge disposition: 01-Home or [facility]     Mcarthur Rossetti, MD Follow up in 2 week(s).   Specialty: Orthopedic Surgery Contact information: [address] [place] [address] [phone]                  Signed: Mcarthur Rossetti 11/18/2021, 7:46 AM

## 2021-11-19 ENCOUNTER — Telehealth: Payer: Self-pay | Admitting: *Deleted

## 2021-11-19 NOTE — Telephone Encounter (Signed)
Ortho bundle D/C call completed. 

## 2021-11-22 ENCOUNTER — Telehealth: Payer: Self-pay | Admitting: *Deleted

## 2021-11-22 NOTE — Telephone Encounter (Signed)
Ortho bundle 7 day call attempted.

## 2021-11-22 NOTE — Telephone Encounter (Signed)
Ortho bundle 7 day call completed. 

## 2021-11-26 ENCOUNTER — Other Ambulatory Visit: Payer: Self-pay | Admitting: Orthopaedic Surgery

## 2021-11-28 ENCOUNTER — Encounter: Payer: Self-pay | Admitting: Orthopaedic Surgery

## 2021-11-28 ENCOUNTER — Ambulatory Visit (INDEPENDENT_AMBULATORY_CARE_PROVIDER_SITE_OTHER): Payer: Medicare PPO | Admitting: Orthopaedic Surgery

## 2021-11-28 DIAGNOSIS — Z96641 Presence of right artificial hip joint: Secondary | ICD-10-CM

## 2021-11-28 MED ORDER — HYDROCODONE-ACETAMINOPHEN 5-325 MG PO TABS: 1.0000 | ORAL_TABLET | Freq: Four times a day (QID) | ORAL | 0 refills | Status: AC | PRN

## 2021-11-28 NOTE — Progress Notes (Signed)
The patient is 2 weeks status post a right total hip arthroplasty.  He is an active 86 year old gentleman and is back to just walking with his walking stick.  He does need to wean from oxycodone to hydrocodone he states.  I am fine with sending this in.  His right hip incision looks great.  His leg lengths are equal.  He is not experiencing much swelling in his feet and ankle so he can stop his baby aspirin twice daily.  He will continue to increase his activities as comfort allows.  He can drive if he does not have narcotics in his system.  He can stop his baby aspirin twice a day.  We will see him back in 4 weeks to see how he is doing overall but no x-rays are needed.  All questions and concerns were answered and addressed.

## 2021-12-03 ENCOUNTER — Telehealth: Payer: Self-pay | Admitting: *Deleted

## 2021-12-03 NOTE — Telephone Encounter (Signed)
Late entry Ortho bundle call for 11/28/21 in office visit with MD. Doing well. Will continue to follow for needs.

## 2021-12-16 ENCOUNTER — Telehealth: Payer: Self-pay | Admitting: Physician Assistant

## 2021-12-16 ENCOUNTER — Telehealth: Payer: Self-pay | Admitting: *Deleted

## 2021-12-16 NOTE — Telephone Encounter (Signed)
Call to patient and updated on instructions per MD.

## 2021-12-16 NOTE — Telephone Encounter (Signed)
Pt called asking for a call back. Pt states he had surgery last month and still experiencing a lot of pain. Please call pt at [phone].

## 2021-12-16 NOTE — Telephone Encounter (Signed)
Patient called-he is 30 days out from surgery and is having quite a bit of neuropathic pain. States he did this also with his last surgery. Asked for recommendations. Not taking Oxycodone, only Tylenol, which helps with hip. Pain he is experiencing is going down front of leg to the top of foot and describes as burning, electric and also hot from inside out. "I feel like my feet are clammy as well". Having some of these symptoms also in other leg, but mostly in surgical leg. Has stopped his Gabapentin because of reading side effects, but when asked, he had some dizziness he thinks with taking, but that's all he can recall. Is there any thing else for this type of pain? He also asked if he could just take at night. Current Rx 100mg  up to 3 times daily. Thanks.

## 2021-12-26 ENCOUNTER — Encounter: Payer: Self-pay | Admitting: Orthopaedic Surgery

## 2021-12-26 ENCOUNTER — Ambulatory Visit (INDEPENDENT_AMBULATORY_CARE_PROVIDER_SITE_OTHER): Payer: Medicare PPO | Admitting: Orthopaedic Surgery

## 2021-12-26 DIAGNOSIS — Z96641 Presence of right artificial hip joint: Secondary | ICD-10-CM

## 2021-12-26 MED ORDER — METHOCARBAMOL 500 MG PO TABS: 500.0000 mg | ORAL_TABLET | Freq: Four times a day (QID) | ORAL | 0 refills | Status: AC | PRN

## 2021-12-26 NOTE — Progress Notes (Signed)
Mr. Rayl is now 6 weeks status post a right total hip arthroplasty.  His right hip is doing well.  He is an 86 year old gentleman.  He has been having some left hip pain but over the trochanteric area.  We replaced his left hip in 2017.  He walks with a much improved gait.  He is not using his cane at this point.  He does need a refill of Robaxin and he does have gabapentin at home that he may use on occasion.  I recommended Voltaren gel for his left hip and showed him some stretching to try.  I would not recommend a steroid injection right now since he is only 6 weeks out from his right hip and steroids can decrease immune response.  He will continue to slowly increase his activities as comfort allows.  I would like to see him back in 4 weeks to make sure he is doing well but no x-rays are needed.  If he is still having left hip pain we may consider a steroid injection on his left hip.  If he is complaining enough about it we will have just a standing low AP pelvis.

## 2021-12-31 ENCOUNTER — Telehealth: Payer: Self-pay

## 2021-12-31 NOTE — Telephone Encounter (Signed)
Dr. Geradine Girt would like premedication sent to patient's pharmacy for dental cleaning.  Cb# [phone].  Please advise.  Thank you.

## 2022-01-01 ENCOUNTER — Other Ambulatory Visit: Payer: Self-pay

## 2022-01-01 MED ORDER — AMOXICILLIN 500 MG PO TABS: ORAL_TABLET | ORAL | 0 refills | Status: AC

## 2022-01-01 NOTE — Telephone Encounter (Signed)
LMOM for patient this was sent to his pharmacy

## 2022-01-20 DIAGNOSIS — L820 Inflamed seborrheic keratosis: Secondary | ICD-10-CM | POA: Diagnosis not present

## 2022-01-20 DIAGNOSIS — L821 Other seborrheic keratosis: Secondary | ICD-10-CM | POA: Diagnosis not present

## 2022-01-20 DIAGNOSIS — I872 Venous insufficiency (chronic) (peripheral): Secondary | ICD-10-CM | POA: Diagnosis not present

## 2022-01-20 DIAGNOSIS — I8311 Varicose veins of right lower extremity with inflammation: Secondary | ICD-10-CM | POA: Diagnosis not present

## 2022-01-20 DIAGNOSIS — D1801 Hemangioma of skin and subcutaneous tissue: Secondary | ICD-10-CM | POA: Diagnosis not present

## 2022-01-20 DIAGNOSIS — L308 Other specified dermatitis: Secondary | ICD-10-CM | POA: Diagnosis not present

## 2022-01-20 DIAGNOSIS — D225 Melanocytic nevi of trunk: Secondary | ICD-10-CM | POA: Diagnosis not present

## 2022-01-20 DIAGNOSIS — L570 Actinic keratosis: Secondary | ICD-10-CM | POA: Diagnosis not present

## 2022-01-20 DIAGNOSIS — I8312 Varicose veins of left lower extremity with inflammation: Secondary | ICD-10-CM | POA: Diagnosis not present

## 2022-01-23 ENCOUNTER — Ambulatory Visit (INDEPENDENT_AMBULATORY_CARE_PROVIDER_SITE_OTHER): Payer: Medicare PPO | Admitting: Orthopaedic Surgery

## 2022-01-23 ENCOUNTER — Encounter: Payer: Self-pay | Admitting: Orthopaedic Surgery

## 2022-01-23 ENCOUNTER — Ambulatory Visit: Payer: Self-pay

## 2022-01-23 DIAGNOSIS — Z96641 Presence of right artificial hip joint: Secondary | ICD-10-CM

## 2022-01-23 NOTE — Progress Notes (Signed)
The patient comes in today at 6 weeks status post a right total hip arthroplasty.  We replaced his left hip several years ago.  He is taking a turn for the better in terms of feeling better overall and mobilizing better.  He is walking without assistive device.  He is an active 86 year old gentleman.  Both hips move smoothly and fluidly.  He still has a little bit of soreness when he first gets up from a seated position I gave him reassurance that that is normal.  His leg lengths also appear normal.  From my standpoint I do not need to see him back for 6 months unless he is having issues.  At that visit we will just have a standing low AP pelvis.  All questions and concerns were answered and addressed.

## 2022-01-30 DIAGNOSIS — Z961 Presence of intraocular lens: Secondary | ICD-10-CM | POA: Diagnosis not present

## 2022-01-30 DIAGNOSIS — H524 Presbyopia: Secondary | ICD-10-CM | POA: Diagnosis not present

## 2022-04-22 DIAGNOSIS — R972 Elevated prostate specific antigen [PSA]: Secondary | ICD-10-CM | POA: Diagnosis not present

## 2022-04-22 DIAGNOSIS — E785 Hyperlipidemia, unspecified: Secondary | ICD-10-CM | POA: Diagnosis not present

## 2022-04-22 DIAGNOSIS — G3184 Mild cognitive impairment, so stated: Secondary | ICD-10-CM | POA: Diagnosis not present

## 2022-04-22 DIAGNOSIS — I878 Other specified disorders of veins: Secondary | ICD-10-CM | POA: Diagnosis not present

## 2022-04-22 DIAGNOSIS — M1990 Unspecified osteoarthritis, unspecified site: Secondary | ICD-10-CM | POA: Diagnosis not present

## 2022-04-22 DIAGNOSIS — R946 Abnormal results of thyroid function studies: Secondary | ICD-10-CM | POA: Diagnosis not present

## 2022-04-22 DIAGNOSIS — I10 Essential (primary) hypertension: Secondary | ICD-10-CM | POA: Diagnosis not present

## 2022-04-22 DIAGNOSIS — L309 Dermatitis, unspecified: Secondary | ICD-10-CM | POA: Diagnosis not present

## 2022-04-22 DIAGNOSIS — N401 Enlarged prostate with lower urinary tract symptoms: Secondary | ICD-10-CM | POA: Diagnosis not present

## 2022-05-15 DIAGNOSIS — E785 Hyperlipidemia, unspecified: Secondary | ICD-10-CM | POA: Diagnosis not present

## 2022-05-15 DIAGNOSIS — I10 Essential (primary) hypertension: Secondary | ICD-10-CM | POA: Diagnosis not present

## 2022-05-15 DIAGNOSIS — R0789 Other chest pain: Secondary | ICD-10-CM | POA: Diagnosis not present

## 2022-05-29 ENCOUNTER — Encounter: Payer: Self-pay | Admitting: Radiology

## 2022-07-24 ENCOUNTER — Ambulatory Visit: Payer: Medicare PPO | Admitting: Orthopaedic Surgery

## 2022-07-24 ENCOUNTER — Encounter: Payer: Self-pay | Admitting: Orthopaedic Surgery

## 2022-07-24 ENCOUNTER — Other Ambulatory Visit (INDEPENDENT_AMBULATORY_CARE_PROVIDER_SITE_OTHER): Payer: Medicare PPO

## 2022-07-24 DIAGNOSIS — Z96641 Presence of right artificial hip joint: Secondary | ICD-10-CM | POA: Diagnosis not present

## 2022-07-24 NOTE — Progress Notes (Signed)
The patient is very well-known to me.  He is 87 years old and very active.  We replaced both his hips with the most recent 1 in August of last year.  That was the right hip.  He still has a "wet and warm feeling that he gets from a sensory standpoint in that right thigh but overall is doing well.  He is walking without any assistive device.  We did talk about the aging process and things to consider in the long run.  Overall though he is doing great.  Both hips move smoothly and fluidly.  His right hip is just only slightly stiff.  He is not walking with any significant limp at all.  An x-ray of his pelvis shows bilateral total hip arthroplasties with no complicating features.  At this point follow-up for his hips can be as needed.  If he does have any issues at all he knows to let us know.

## 2022-10-23 DIAGNOSIS — E785 Hyperlipidemia, unspecified: Secondary | ICD-10-CM | POA: Diagnosis not present

## 2022-10-23 DIAGNOSIS — N401 Enlarged prostate with lower urinary tract symptoms: Secondary | ICD-10-CM | POA: Diagnosis not present

## 2022-10-23 DIAGNOSIS — R7989 Other specified abnormal findings of blood chemistry: Secondary | ICD-10-CM | POA: Diagnosis not present

## 2022-10-23 DIAGNOSIS — I10 Essential (primary) hypertension: Secondary | ICD-10-CM | POA: Diagnosis not present

## 2022-10-23 DIAGNOSIS — E559 Vitamin D deficiency, unspecified: Secondary | ICD-10-CM | POA: Diagnosis not present

## 2022-10-30 DIAGNOSIS — R972 Elevated prostate specific antigen [PSA]: Secondary | ICD-10-CM | POA: Diagnosis not present

## 2022-10-30 DIAGNOSIS — Z1331 Encounter for screening for depression: Secondary | ICD-10-CM | POA: Diagnosis not present

## 2022-10-30 DIAGNOSIS — M1990 Unspecified osteoarthritis, unspecified site: Secondary | ICD-10-CM | POA: Diagnosis not present

## 2022-10-30 DIAGNOSIS — G3184 Mild cognitive impairment, so stated: Secondary | ICD-10-CM | POA: Diagnosis not present

## 2022-10-30 DIAGNOSIS — Z0000 Encounter for general adult medical examination without abnormal findings: Secondary | ICD-10-CM | POA: Diagnosis not present

## 2022-10-30 DIAGNOSIS — I10 Essential (primary) hypertension: Secondary | ICD-10-CM | POA: Diagnosis not present

## 2022-10-30 DIAGNOSIS — R82998 Other abnormal findings in urine: Secondary | ICD-10-CM | POA: Diagnosis not present

## 2022-10-30 DIAGNOSIS — M810 Age-related osteoporosis without current pathological fracture: Secondary | ICD-10-CM | POA: Diagnosis not present

## 2022-10-30 DIAGNOSIS — E785 Hyperlipidemia, unspecified: Secondary | ICD-10-CM | POA: Diagnosis not present

## 2022-10-30 DIAGNOSIS — R42 Dizziness and giddiness: Secondary | ICD-10-CM | POA: Diagnosis not present

## 2022-10-30 DIAGNOSIS — Z9989 Dependence on other enabling machines and devices: Secondary | ICD-10-CM | POA: Diagnosis not present

## 2022-10-30 DIAGNOSIS — Z23 Encounter for immunization: Secondary | ICD-10-CM | POA: Diagnosis not present

## 2022-11-06 ENCOUNTER — Other Ambulatory Visit (INDEPENDENT_AMBULATORY_CARE_PROVIDER_SITE_OTHER): Payer: Medicare PPO

## 2022-11-06 ENCOUNTER — Ambulatory Visit: Payer: Medicare PPO | Admitting: Physician Assistant

## 2022-11-06 ENCOUNTER — Encounter: Payer: Self-pay | Admitting: Physician Assistant

## 2022-11-06 DIAGNOSIS — M7062 Trochanteric bursitis, left hip: Secondary | ICD-10-CM | POA: Diagnosis not present

## 2022-11-06 DIAGNOSIS — Z96641 Presence of right artificial hip joint: Secondary | ICD-10-CM | POA: Diagnosis not present

## 2022-11-06 DIAGNOSIS — Z96642 Presence of left artificial hip joint: Secondary | ICD-10-CM

## 2022-11-06 DIAGNOSIS — Z96643 Presence of artificial hip joint, bilateral: Secondary | ICD-10-CM | POA: Diagnosis not present

## 2022-11-06 MED ORDER — METHYLPREDNISOLONE ACETATE 40 MG/ML IJ SUSP
40.0000 mg | INTRAMUSCULAR | Status: AC | PRN
  Administered 2022-11-06: 40 mg via INTRA_ARTICULAR

## 2022-11-06 MED ORDER — LIDOCAINE HCL 1 % IJ SOLN
3.0000 mL | INTRAMUSCULAR | Status: AC | PRN
  Administered 2022-11-06: 3 mL

## 2022-11-06 NOTE — Progress Notes (Signed)
HPI: [name] comes in today for continued right hip pain that feels wet and hot.  He describes it as a burning pain also.  This the lateral aspect of his right hip.  States feels like it is deep inside.  He is asking for radiographs to be performed both hips.  He status post right total hip arthroplasty 11/15/2021.  His left hip he is concerned about due to some pain over the past 2 days.  Pain only after walking.  He stated this pain began about the lateral aspect of his left hip after exercising 2 days ago.  No known injury to either hip.  Denies any fevers chills.  Patient is nondiabetic.  Review of systems: See HPI otherwise negative  Physical exam: General: Well-developed well-nourished male no acute distress ambulates with a cane.  No antalgic gait. Bilateral hips: Good range of motion of both hips without pain.  Tenderness over the left trochanteric region only.  Radiographs: AP pelvis lateral view left hip: Status post bilateral total hip arthroplasties which are well-seated.  No acute fractures acute findings.  Bilateral hips well located.  Impression: Status post bilateral total hip arthroplasties Left hip trochanteric bursitis Right hip paresthesia   Plan: Will let him go back on his Neurontin as he is not sure that he took Neurontin for any length of time and is not sure he took it as directed.  Recommend he go back on 100 mg 3 times daily.  If he tolerates this well over a week then we will titrate it up to 200 mg 3 times daily.  He is shown IT band stretching exercises.  He was agreeable to a left hip trochanteric injection which he tolerated well today.  He will follow-up with [place] pain persist or becomes worse.  Questions encouraged and answered.  He will call us in a week and let us know how he has tolerated the Neurontin.       Procedure Note  Patient: Jesus Wagner             Date of Birth: [birth year]           MRN: [id number]             Visit Date:  11/06/2022  Procedures: Visit Diagnoses:  1. Status post total replacement of right hip   2. History of total left hip arthroplasty   3. Trochanteric bursitis, left hip     Large Joint Inj: L greater trochanter on 11/06/2022 5:11 PM Indications: pain Details: 22 G 1.5 in needle, lateral approach  Arthrogram: No  Medications: 3 mL lidocaine 1 %; 40 mg methylPREDNISolone acetate 40 MG/ML Outcome: tolerated well, no immediate complications Procedure, treatment alternatives, risks and benefits explained, specific risks discussed. Consent was given by the patient. Immediately prior to procedure a time out was called to verify the correct patient, procedure, equipment, support staff and site/side marked as required. Patient was prepped and draped in the usual sterile fashion.

## 2022-11-11 ENCOUNTER — Other Ambulatory Visit: Payer: Self-pay | Admitting: Orthopaedic Surgery

## 2022-12-27 DIAGNOSIS — Z23 Encounter for immunization: Secondary | ICD-10-CM | POA: Diagnosis not present

## 2023-02-18 DIAGNOSIS — I872 Venous insufficiency (chronic) (peripheral): Secondary | ICD-10-CM | POA: Diagnosis not present

## 2023-02-18 DIAGNOSIS — I8312 Varicose veins of left lower extremity with inflammation: Secondary | ICD-10-CM | POA: Diagnosis not present

## 2023-02-18 DIAGNOSIS — L570 Actinic keratosis: Secondary | ICD-10-CM | POA: Diagnosis not present

## 2023-02-18 DIAGNOSIS — I8311 Varicose veins of right lower extremity with inflammation: Secondary | ICD-10-CM | POA: Diagnosis not present

## 2023-02-18 DIAGNOSIS — L820 Inflamed seborrheic keratosis: Secondary | ICD-10-CM | POA: Diagnosis not present

## 2023-02-18 DIAGNOSIS — L308 Other specified dermatitis: Secondary | ICD-10-CM | POA: Diagnosis not present

## 2023-02-18 DIAGNOSIS — L821 Other seborrheic keratosis: Secondary | ICD-10-CM | POA: Diagnosis not present

## 2023-05-04 DIAGNOSIS — I10 Essential (primary) hypertension: Secondary | ICD-10-CM | POA: Diagnosis not present

## 2023-05-04 DIAGNOSIS — E785 Hyperlipidemia, unspecified: Secondary | ICD-10-CM | POA: Diagnosis not present

## 2023-05-04 DIAGNOSIS — R946 Abnormal results of thyroid function studies: Secondary | ICD-10-CM | POA: Diagnosis not present

## 2023-05-04 DIAGNOSIS — M1990 Unspecified osteoarthritis, unspecified site: Secondary | ICD-10-CM | POA: Diagnosis not present

## 2023-05-04 DIAGNOSIS — N401 Enlarged prostate with lower urinary tract symptoms: Secondary | ICD-10-CM | POA: Diagnosis not present

## 2023-05-04 DIAGNOSIS — R0789 Other chest pain: Secondary | ICD-10-CM | POA: Diagnosis not present

## 2023-05-04 DIAGNOSIS — R972 Elevated prostate specific antigen [PSA]: Secondary | ICD-10-CM | POA: Diagnosis not present

## 2023-05-04 DIAGNOSIS — M810 Age-related osteoporosis without current pathological fracture: Secondary | ICD-10-CM | POA: Diagnosis not present

## 2023-05-04 DIAGNOSIS — G3184 Mild cognitive impairment, so stated: Secondary | ICD-10-CM | POA: Diagnosis not present

## 2023-05-11 DIAGNOSIS — H52203 Unspecified astigmatism, bilateral: Secondary | ICD-10-CM | POA: Diagnosis not present

## 2023-05-11 DIAGNOSIS — Z961 Presence of intraocular lens: Secondary | ICD-10-CM | POA: Diagnosis not present

## 2023-05-11 DIAGNOSIS — H26491 Other secondary cataract, right eye: Secondary | ICD-10-CM | POA: Diagnosis not present

## 2023-10-26 DIAGNOSIS — I10 Essential (primary) hypertension: Secondary | ICD-10-CM | POA: Diagnosis not present

## 2023-10-26 DIAGNOSIS — M810 Age-related osteoporosis without current pathological fracture: Secondary | ICD-10-CM | POA: Diagnosis not present

## 2023-10-26 DIAGNOSIS — E785 Hyperlipidemia, unspecified: Secondary | ICD-10-CM | POA: Diagnosis not present

## 2023-10-26 DIAGNOSIS — R972 Elevated prostate specific antigen [PSA]: Secondary | ICD-10-CM | POA: Diagnosis not present

## 2023-10-26 DIAGNOSIS — E559 Vitamin D deficiency, unspecified: Secondary | ICD-10-CM | POA: Diagnosis not present

## 2023-10-26 DIAGNOSIS — R946 Abnormal results of thyroid function studies: Secondary | ICD-10-CM | POA: Diagnosis not present

## 2023-10-26 DIAGNOSIS — N401 Enlarged prostate with lower urinary tract symptoms: Secondary | ICD-10-CM | POA: Diagnosis not present

## 2023-11-02 DIAGNOSIS — R0789 Other chest pain: Secondary | ICD-10-CM | POA: Diagnosis not present

## 2023-11-02 DIAGNOSIS — R82998 Other abnormal findings in urine: Secondary | ICD-10-CM | POA: Diagnosis not present

## 2023-11-02 DIAGNOSIS — M810 Age-related osteoporosis without current pathological fracture: Secondary | ICD-10-CM | POA: Diagnosis not present

## 2023-11-02 DIAGNOSIS — N401 Enlarged prostate with lower urinary tract symptoms: Secondary | ICD-10-CM | POA: Diagnosis not present

## 2023-11-02 DIAGNOSIS — G3184 Mild cognitive impairment, so stated: Secondary | ICD-10-CM | POA: Diagnosis not present

## 2023-11-02 DIAGNOSIS — I10 Essential (primary) hypertension: Secondary | ICD-10-CM | POA: Diagnosis not present

## 2023-11-02 DIAGNOSIS — Z0000 Encounter for general adult medical examination without abnormal findings: Secondary | ICD-10-CM | POA: Diagnosis not present

## 2023-11-02 DIAGNOSIS — R972 Elevated prostate specific antigen [PSA]: Secondary | ICD-10-CM | POA: Diagnosis not present

## 2023-11-02 DIAGNOSIS — E785 Hyperlipidemia, unspecified: Secondary | ICD-10-CM | POA: Diagnosis not present

## 2023-12-08 DIAGNOSIS — M810 Age-related osteoporosis without current pathological fracture: Secondary | ICD-10-CM | POA: Diagnosis not present

## 2023-12-10 DIAGNOSIS — H6123 Impacted cerumen, bilateral: Secondary | ICD-10-CM | POA: Diagnosis not present

## 2023-12-21 DIAGNOSIS — E559 Vitamin D deficiency, unspecified: Secondary | ICD-10-CM | POA: Diagnosis not present

## 2023-12-21 DIAGNOSIS — I10 Essential (primary) hypertension: Secondary | ICD-10-CM | POA: Diagnosis not present

## 2023-12-21 DIAGNOSIS — M810 Age-related osteoporosis without current pathological fracture: Secondary | ICD-10-CM | POA: Diagnosis not present

## 2023-12-24 ENCOUNTER — Other Ambulatory Visit (HOSPITAL_COMMUNITY): Payer: Self-pay | Admitting: Pharmacy Technician

## 2023-12-24 DIAGNOSIS — M810 Age-related osteoporosis without current pathological fracture: Secondary | ICD-10-CM | POA: Insufficient documentation

## 2024-01-01 ENCOUNTER — Other Ambulatory Visit (HOSPITAL_COMMUNITY): Payer: Self-pay | Admitting: Pharmacy Technician

## 2024-01-01 ENCOUNTER — Other Ambulatory Visit (HOSPITAL_COMMUNITY): Payer: Self-pay | Admitting: Internal Medicine

## 2024-01-01 ENCOUNTER — Telehealth (HOSPITAL_COMMUNITY): Payer: Self-pay | Admitting: Pharmacy Technician

## 2024-01-01 ENCOUNTER — Encounter (HOSPITAL_COMMUNITY): Payer: Self-pay | Admitting: Internal Medicine

## 2024-01-01 NOTE — Telephone Encounter (Signed)
 Auth Submission: NO AUTH NEEDED Site of care: [facility] Payer: [employer] Medication & CPT/J Code(s) submitted: [id number]  Diagnosis Code: M81.0 Route of submission (phone, fax, portal): portal Phone # Fax # Auth type: Buy/Bill HB Units/visits requested: 60mg  x 2 doses, q 6 months Reference number: [id number] Approval from: 01/01/24 to 03/23/24       Dagoberto Armour, CPhT [facility] Phone: [phone] 01/01/2024

## 2024-01-04 DIAGNOSIS — K5904 Chronic idiopathic constipation: Secondary | ICD-10-CM | POA: Diagnosis not present

## 2024-01-04 DIAGNOSIS — R634 Abnormal weight loss: Secondary | ICD-10-CM | POA: Diagnosis not present

## 2024-01-07 ENCOUNTER — Other Ambulatory Visit (HOSPITAL_COMMUNITY): Payer: Self-pay | Admitting: Internal Medicine

## 2024-01-08 ENCOUNTER — Telehealth (HOSPITAL_COMMUNITY): Payer: Self-pay | Admitting: Pharmacy Technician

## 2024-01-08 NOTE — Telephone Encounter (Signed)
 Auth Submission: NO AUTH NEEDED Site of care: [facility] Payer: [employer] Medication & CPT/J Code(s) submitted: Reclast (Zolendronic acid) [id number] Diagnosis Code: M81.0 Route of submission (phone, fax, portal): portal Phone # Fax # Auth type: Buy/Bill HB Units/visits requested: 5mg  x 1 dose, q 12 months Reference number:  Approval from: 01/08/2024 to 03/23/24    Dagoberto Armour, CPhT [facility] Phone: [phone] 01/08/2024

## 2024-01-13 ENCOUNTER — Encounter: Payer: Self-pay | Admitting: Orthopaedic Surgery

## 2024-01-13 ENCOUNTER — Other Ambulatory Visit: Payer: Self-pay

## 2024-01-13 ENCOUNTER — Ambulatory Visit: Admitting: Orthopaedic Surgery

## 2024-01-13 DIAGNOSIS — M5410 Radiculopathy, site unspecified: Secondary | ICD-10-CM

## 2024-01-13 DIAGNOSIS — M79651 Pain in right thigh: Secondary | ICD-10-CM

## 2024-01-13 NOTE — Progress Notes (Signed)
 The patient is an 88 year old gentleman very well-known to us .  We replaced his left hip back in 2015 secondary arthritis in his right hip back in 2023 secondary to osteoarthritis.  He reports almost radicular type of symptoms in his right thigh.  He describes it is a hot feeling at times and sometimes even a damp feeling.  He denies any back pain and denies any specific pain at all but this has been going on and waxing and waning for the last 2 years.  We have x-rayed his hip several times and have not found any issues with the hip replacement itself or the prosthesis components.  On exam his right hip moves smoothly and fluidly.  He does seem to have a positive straight leg raise to the right side.  There is no atrophy of the muscles around his right thigh and his right lower extremity has good strength.  At this point given his continued radicular symptoms, a MRI of the lumbar spine is warranted to rule out nerve compression that may be causing the radicular symptoms that he is describing.  He has tried and failed all forms of conservative treatment for 2 years now as a relates to this issue.  We will see him back in follow-up once we have this MRI.

## 2024-01-15 ENCOUNTER — Encounter (HOSPITAL_COMMUNITY)
Admission: RE | Admit: 2024-01-15 | Discharge: 2024-01-15 | Disposition: A | Discharge status: Home / Self Care | Source: Ambulatory Visit | Attending: Internal Medicine | Admitting: Internal Medicine

## 2024-01-15 VITALS — BP 128/70 | HR 61 | Temp 97.7°F | Resp 16

## 2024-01-15 DIAGNOSIS — M810 Age-related osteoporosis without current pathological fracture: Secondary | ICD-10-CM | POA: Diagnosis not present

## 2024-01-15 MED ORDER — SODIUM CHLORIDE 0.9 % IV SOLN: INTRAVENOUS | Status: DC

## 2024-01-15 MED ORDER — ZOLEDRONIC ACID 5 MG/100ML IV SOLN
INTRAVENOUS | Status: AC
  Filled 2024-01-15: qty 100

## 2024-01-15 MED ORDER — ACETAMINOPHEN 325 MG PO TABS: 650.0000 mg | ORAL_TABLET | Freq: Once | ORAL | Status: DC

## 2024-01-15 MED ORDER — ZOLEDRONIC ACID 5 MG/100ML IV SOLN
5.0000 mg | Freq: Once | INTRAVENOUS | Status: AC
  Administered 2024-01-15: 5 mg via INTRAVENOUS

## 2024-01-15 MED ORDER — DIPHENHYDRAMINE HCL 25 MG PO CAPS: 25.0000 mg | ORAL_CAPSULE | Freq: Once | ORAL | Status: DC

## 2024-01-25 ENCOUNTER — Encounter: Payer: Self-pay | Admitting: Radiology

## 2024-02-04 ENCOUNTER — Other Ambulatory Visit

## 2024-02-16 DIAGNOSIS — G3184 Mild cognitive impairment, so stated: Secondary | ICD-10-CM | POA: Diagnosis not present

## 2024-02-16 DIAGNOSIS — M810 Age-related osteoporosis without current pathological fracture: Secondary | ICD-10-CM | POA: Diagnosis not present

## 2024-02-16 DIAGNOSIS — E559 Vitamin D deficiency, unspecified: Secondary | ICD-10-CM | POA: Diagnosis not present

## 2024-02-16 DIAGNOSIS — M1990 Unspecified osteoarthritis, unspecified site: Secondary | ICD-10-CM | POA: Diagnosis not present

## 2024-02-16 DIAGNOSIS — I10 Essential (primary) hypertension: Secondary | ICD-10-CM | POA: Diagnosis not present
